# Patient Record
Sex: Female | Born: 1970 | Race: White | Hispanic: No | Marital: Married | Smoking: Never smoker
Health system: Southern US, Community
[De-identification: ages and names within clinical notes are randomized; demographics above are authoritative.]

## PROBLEM LIST (undated history)

## (undated) DIAGNOSIS — J385 Laryngeal spasm: Secondary | ICD-10-CM

## (undated) DIAGNOSIS — I639 Cerebral infarction, unspecified: Secondary | ICD-10-CM

## (undated) DIAGNOSIS — E042 Nontoxic multinodular goiter: Secondary | ICD-10-CM

## (undated) DIAGNOSIS — M3500 Sicca syndrome, unspecified: Secondary | ICD-10-CM

## (undated) DIAGNOSIS — R51 Headache: Secondary | ICD-10-CM

## (undated) DIAGNOSIS — K219 Gastro-esophageal reflux disease without esophagitis: Secondary | ICD-10-CM

## (undated) DIAGNOSIS — I776 Arteritis, unspecified: Secondary | ICD-10-CM

## (undated) DIAGNOSIS — Z9889 Other specified postprocedural states: Secondary | ICD-10-CM

## (undated) DIAGNOSIS — R519 Headache, unspecified: Secondary | ICD-10-CM

## (undated) DIAGNOSIS — E0590 Thyrotoxicosis, unspecified without thyrotoxic crisis or storm: Secondary | ICD-10-CM

## (undated) DIAGNOSIS — M359 Systemic involvement of connective tissue, unspecified: Secondary | ICD-10-CM

## (undated) DIAGNOSIS — G629 Polyneuropathy, unspecified: Secondary | ICD-10-CM

## (undated) HISTORY — DX: Arteritis, unspecified: I77.6

## (undated) HISTORY — DX: Headache: R51

## (undated) HISTORY — DX: Other specified postprocedural states: Z98.890

## (undated) HISTORY — DX: Cerebral infarction, unspecified: I63.9

## (undated) HISTORY — DX: Sjogren syndrome, unspecified: M35.00

## (undated) HISTORY — PX: CERVICAL CONE BIOPSY: SUR198

## (undated) HISTORY — PX: WISDOM TOOTH EXTRACTION: SHX21

## (undated) HISTORY — PX: FOOT SURGERY: SHX648

## (undated) HISTORY — DX: Headache, unspecified: R51.9

---

## 2005-03-09 HISTORY — PX: THYMECTOMY: SHX1063

## 2006-07-27 HISTORY — PX: OTHER SURGICAL HISTORY: SHX169

## 2009-01-01 ENCOUNTER — Emergency Department (HOSPITAL_COMMUNITY): Admission: EM | Admit: 2009-01-01 | Discharge: 2009-01-01 | Payer: Self-pay | Admitting: Emergency Medicine

## 2009-02-13 ENCOUNTER — Ambulatory Visit (HOSPITAL_COMMUNITY): Admission: RE | Admit: 2009-02-13 | Discharge: 2009-02-13 | Payer: Self-pay | Admitting: Obstetrics and Gynecology

## 2009-03-12 ENCOUNTER — Ambulatory Visit (HOSPITAL_COMMUNITY): Admission: RE | Admit: 2009-03-12 | Discharge: 2009-03-12 | Payer: Self-pay | Admitting: Obstetrics and Gynecology

## 2009-04-11 ENCOUNTER — Ambulatory Visit (HOSPITAL_COMMUNITY): Admission: RE | Admit: 2009-04-11 | Discharge: 2009-04-11 | Payer: Self-pay | Admitting: Obstetrics and Gynecology

## 2009-05-02 ENCOUNTER — Ambulatory Visit (HOSPITAL_COMMUNITY): Admission: RE | Admit: 2009-05-02 | Discharge: 2009-05-02 | Payer: Self-pay | Admitting: Obstetrics and Gynecology

## 2009-06-03 ENCOUNTER — Ambulatory Visit (HOSPITAL_COMMUNITY): Admission: RE | Admit: 2009-06-03 | Discharge: 2009-06-03 | Payer: Self-pay | Admitting: Obstetrics and Gynecology

## 2009-07-01 ENCOUNTER — Ambulatory Visit (HOSPITAL_COMMUNITY): Admission: RE | Admit: 2009-07-01 | Discharge: 2009-07-01 | Payer: Self-pay | Admitting: Obstetrics and Gynecology

## 2009-07-22 ENCOUNTER — Inpatient Hospital Stay (HOSPITAL_COMMUNITY): Admission: RE | Admit: 2009-07-22 | Discharge: 2009-07-24 | Payer: Self-pay | Admitting: Obstetrics and Gynecology

## 2009-07-27 ENCOUNTER — Encounter: Admission: RE | Admit: 2009-07-27 | Discharge: 2009-08-26 | Payer: Self-pay | Admitting: Obstetrics and Gynecology

## 2009-08-27 ENCOUNTER — Encounter: Admission: RE | Admit: 2009-08-27 | Discharge: 2009-08-29 | Payer: Self-pay | Admitting: Obstetrics and Gynecology

## 2010-03-29 IMAGING — US US OB FOLLOW-UP
1 series · 18 of 28 positions shown · non-contrast
Comparison: none

OBSTETRICAL ULTRASOUND:
 This ultrasound was performed in The [HOSPITAL], and the [facility] report will be stored to [REDACTED] PACS.  This report is also available in [HOSPITAL]?s accessANYware.

[Series 1: us ob follow-up · 40 acquisitions, 18 frames shown]
[im 1/40]
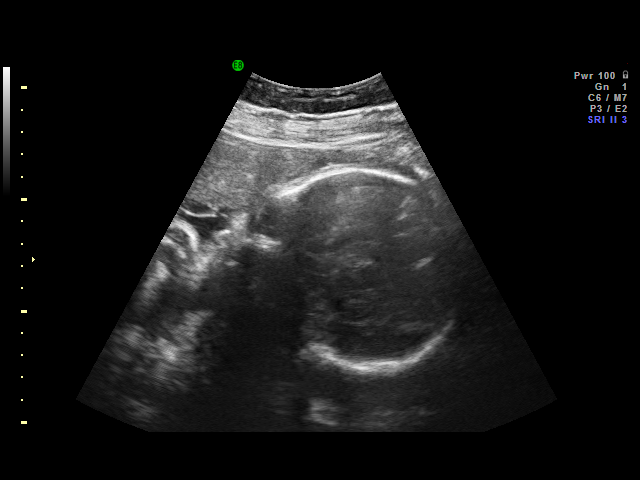
[im 3/40]
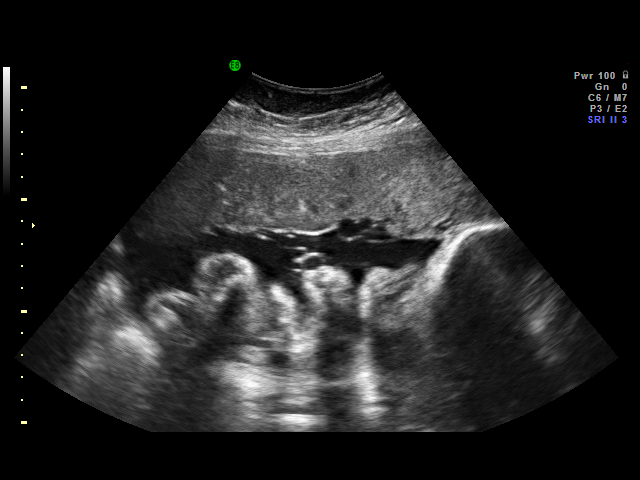
[im 5/40]
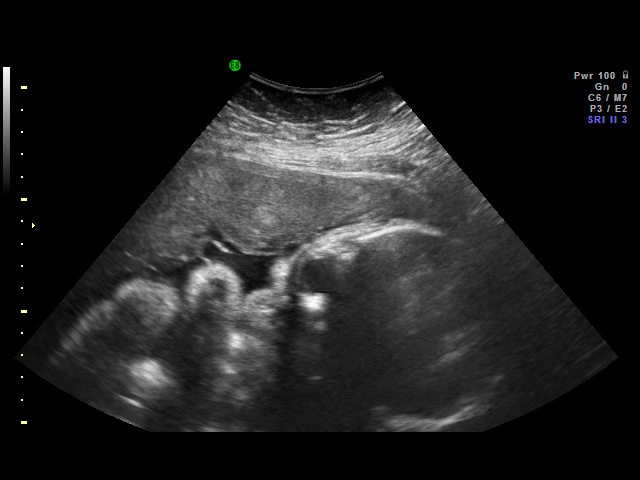
[im 8/40]
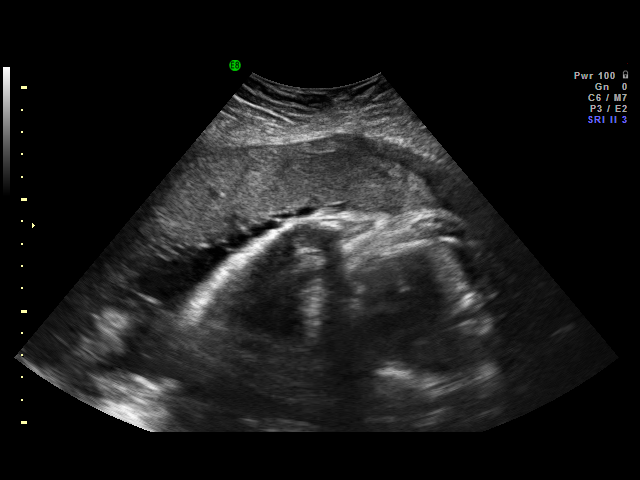
[im 11/40]
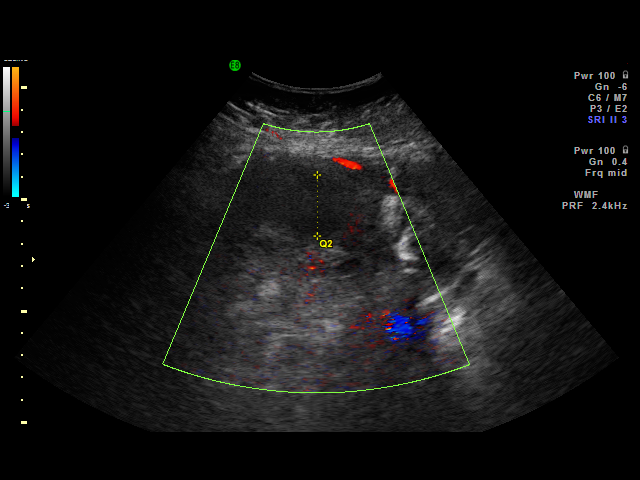
[im 12/40]
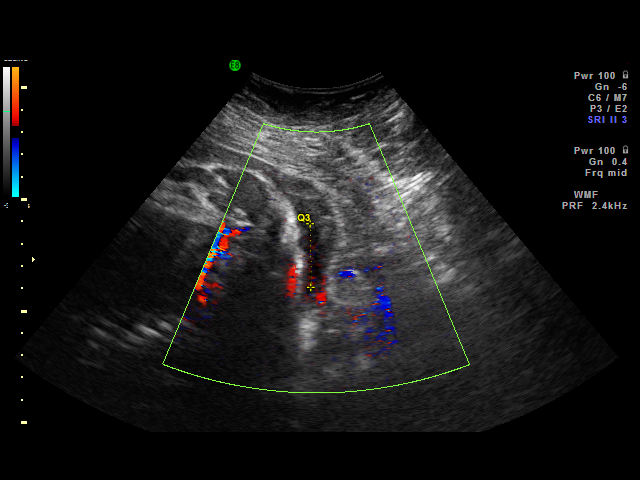
[im 15/40]
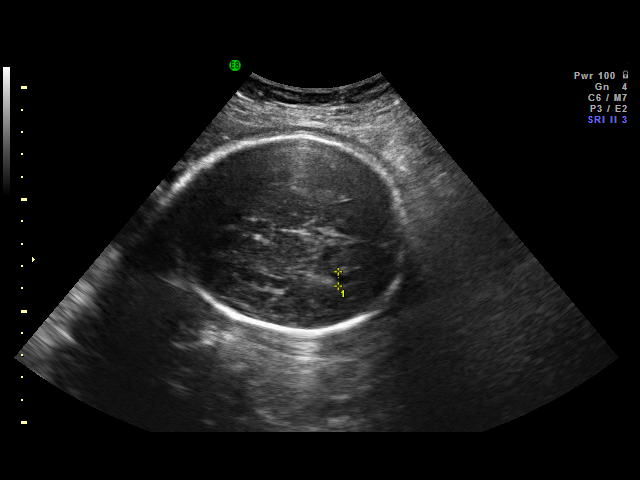
[im 16/40]
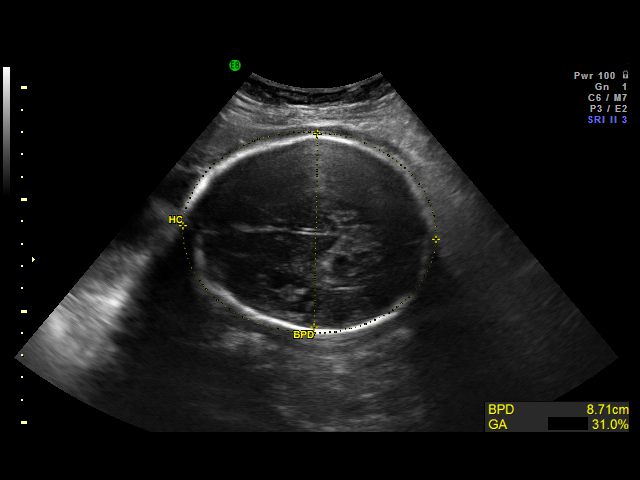
[im 19/40]
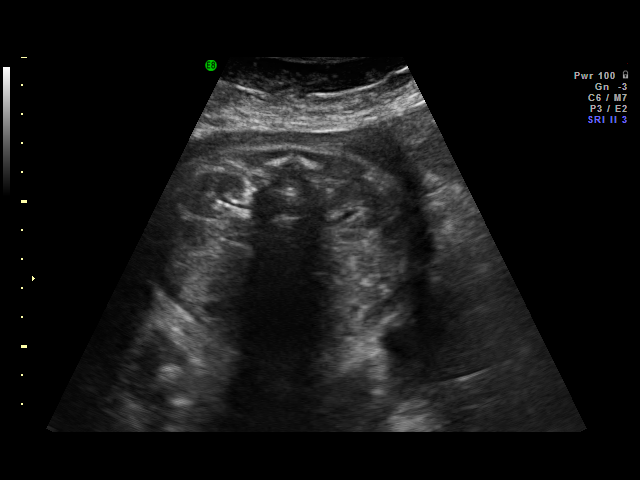
[im 21/40]
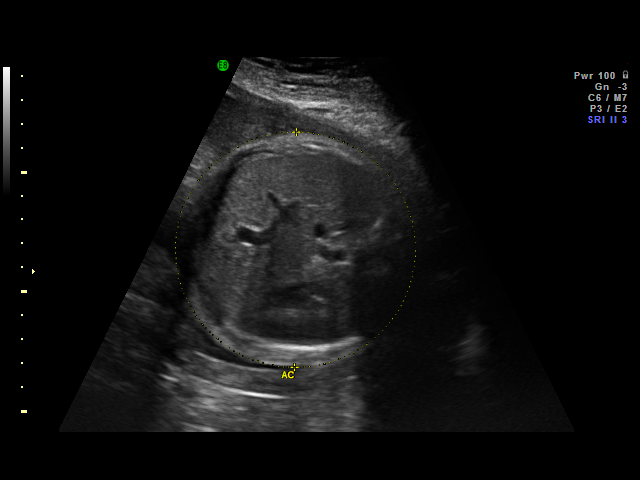
[im 24/40]
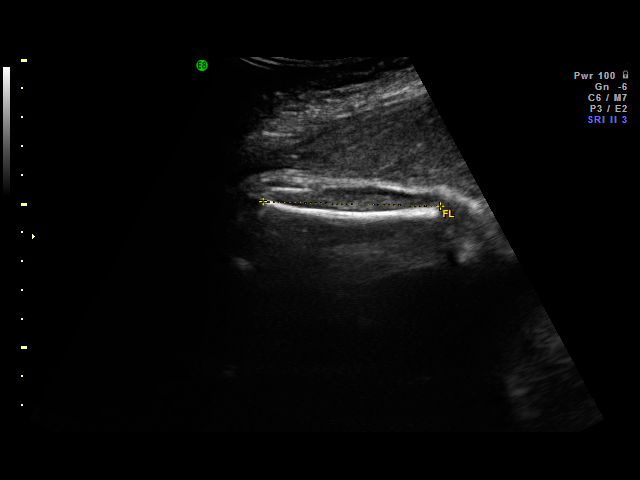
[im 25/40]
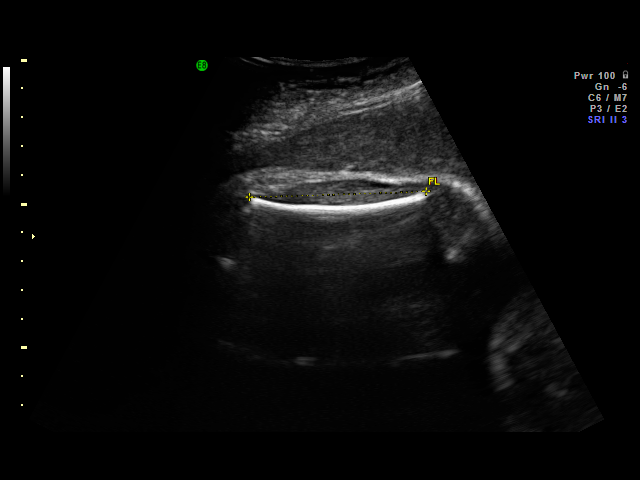
[im 28/40]
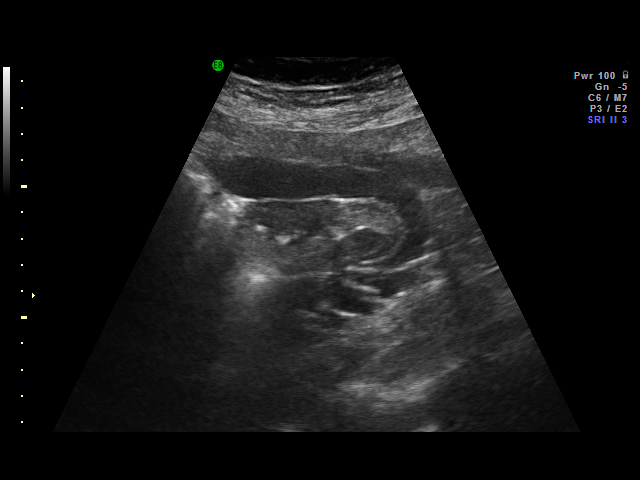
[im 31/40]
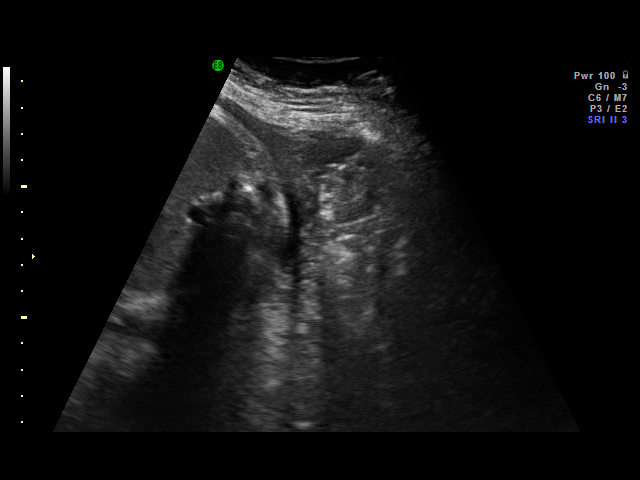
[im 32/40]
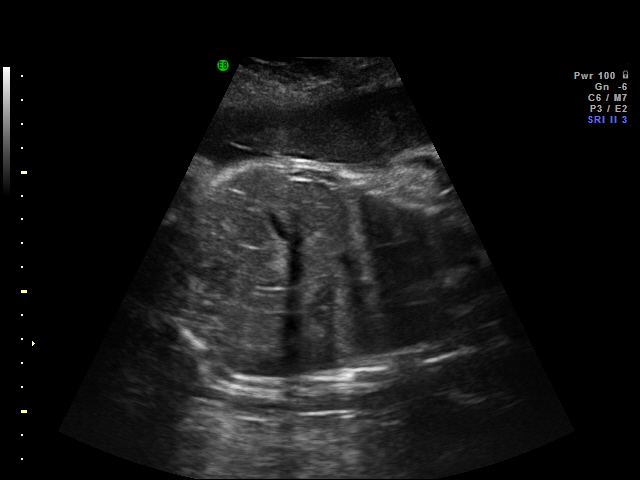
[im 35/40]
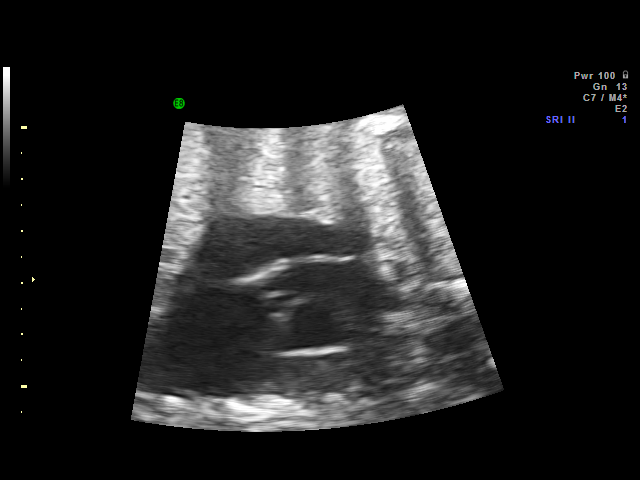
[im 37/40]
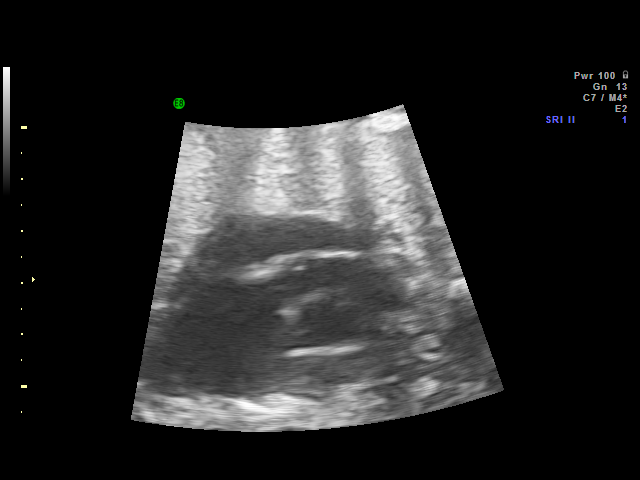
[im 40/40]
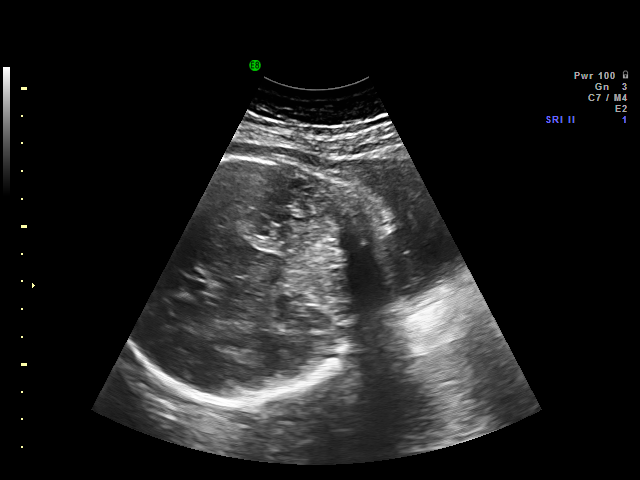

[18 of 28 positions shown; findings below may reference images not displayed]

IMPRESSION: [facility] has also been faxed to the ordering physician.

## 2010-10-27 LAB — COMPREHENSIVE METABOLIC PANEL
Albumin: 3.1 g/dL — ABNORMAL LOW (ref 3.5–5.2)
Calcium: 10 mg/dL (ref 8.4–10.5)
Chloride: 105 mEq/L (ref 96–112)
Creatinine, Ser: 0.6 mg/dL (ref 0.4–1.2)
Total Bilirubin: 0.3 mg/dL (ref 0.3–1.2)
Total Protein: 6 g/dL (ref 6.0–8.3)

## 2010-10-27 LAB — CBC
Hemoglobin: 10.7 g/dL — ABNORMAL LOW (ref 12.0–15.0)
Hemoglobin: 11.9 g/dL — ABNORMAL LOW (ref 12.0–15.0)
MCHC: 34 g/dL (ref 30.0–36.0)
MCHC: 34.6 g/dL (ref 30.0–36.0)
MCV: 88.3 fL (ref 78.0–100.0)
Platelets: 193 10*3/uL (ref 150–400)
RBC: 3.49 MIL/uL — ABNORMAL LOW (ref 3.87–5.11)
RBC: 3.98 MIL/uL (ref 3.87–5.11)
RDW: 13.1 % (ref 11.5–15.5)
WBC: 12.9 10*3/uL — ABNORMAL HIGH (ref 4.0–10.5)
WBC: 9.6 10*3/uL (ref 4.0–10.5)

## 2010-10-27 LAB — URIC ACID: Uric Acid, Serum: 7.7 mg/dL — ABNORMAL HIGH (ref 2.4–7.0)

## 2010-10-27 LAB — RPR: RPR Ser Ql: NONREACTIVE

## 2010-11-02 LAB — LUPUS ANTICOAGULANT PANEL
DRVVT: 38.3 secs (ref 36.1–47.0)
Lupus Anticoagulant: NOT DETECTED

## 2010-11-02 LAB — CARDIOLIPIN ANTIBODIES, IGM+IGG: Anticardiolipin IgG: <10 [GPL'U] — ABNORMAL LOW (ref <11)

## 2010-11-02 LAB — TSH: TSH: 0.204 u[IU]/mL — ABNORMAL LOW (ref 0.350–4.500)

## 2010-11-02 LAB — T4, FREE: Free T4: 0.98 ng/dL (ref 0.80–1.80)

## 2010-11-03 LAB — WET PREP, GENITAL
Clue Cells Wet Prep HPF POC: NONE SEEN
Yeast Wet Prep HPF POC: NONE SEEN

## 2010-11-03 LAB — GC/CHLAMYDIA PROBE AMP, GENITAL
Chlamydia, DNA Probe: NEGATIVE
GC Probe Amp, Genital: NEGATIVE

## 2010-11-03 LAB — CBC
Hemoglobin: 12.7 g/dL (ref 12.0–15.0)
RBC: 4.3 MIL/uL (ref 3.87–5.11)
RDW: 13.7 % (ref 11.5–15.5)
WBC: 8.5 10*3/uL (ref 4.0–10.5)

## 2010-11-03 LAB — URINALYSIS, ROUTINE W REFLEX MICROSCOPIC
Leukocytes, UA: NEGATIVE
Nitrite: NEGATIVE
Specific Gravity, Urine: 1.028 (ref 1.005–1.030)
Urobilinogen, UA: 1 mg/dL (ref 0.0–1.0)

## 2010-11-03 LAB — DIFFERENTIAL
Basophils Absolute: 0 10*3/uL (ref 0.0–0.1)
Lymphocytes Relative: 16 % (ref 12–46)
Lymphs Abs: 1.4 10*3/uL (ref 0.7–4.0)
Monocytes Absolute: 0.5 10*3/uL (ref 0.1–1.0)
Monocytes Relative: 6 % (ref 3–12)
Neutro Abs: 6.5 10*3/uL (ref 1.7–7.7)

## 2010-11-03 LAB — URINE MICROSCOPIC-ADD ON

## 2010-11-03 LAB — POCT PREGNANCY, URINE: Preg Test, Ur: POSITIVE

## 2010-11-03 LAB — URINE CULTURE

## 2012-07-27 HISTORY — PX: OTHER SURGICAL HISTORY: SHX169

## 2012-09-12 ENCOUNTER — Emergency Department (HOSPITAL_COMMUNITY)
Admission: EM | Admit: 2012-09-12 | Discharge: 2012-09-12 | Disposition: A | Discharge status: Home / Self Care | Payer: 59 | Attending: Emergency Medicine | Admitting: Emergency Medicine

## 2012-09-12 ENCOUNTER — Encounter (HOSPITAL_COMMUNITY): Payer: Self-pay | Admitting: Emergency Medicine

## 2012-09-12 DIAGNOSIS — Z862 Personal history of diseases of the blood and blood-forming organs and certain disorders involving the immune mechanism: Secondary | ICD-10-CM | POA: Insufficient documentation

## 2012-09-12 DIAGNOSIS — Z8639 Personal history of other endocrine, nutritional and metabolic disease: Secondary | ICD-10-CM | POA: Insufficient documentation

## 2012-09-12 DIAGNOSIS — R05 Cough: Secondary | ICD-10-CM | POA: Insufficient documentation

## 2012-09-12 DIAGNOSIS — R059 Cough, unspecified: Secondary | ICD-10-CM | POA: Insufficient documentation

## 2012-09-12 DIAGNOSIS — R509 Fever, unspecified: Secondary | ICD-10-CM | POA: Insufficient documentation

## 2012-09-12 DIAGNOSIS — J069 Acute upper respiratory infection, unspecified: Secondary | ICD-10-CM | POA: Insufficient documentation

## 2012-09-12 DIAGNOSIS — R52 Pain, unspecified: Secondary | ICD-10-CM | POA: Insufficient documentation

## 2012-09-12 HISTORY — DX: Thyrotoxicosis, unspecified without thyrotoxic crisis or storm: E05.90

## 2012-09-12 HISTORY — DX: Systemic involvement of connective tissue, unspecified: M35.9

## 2012-09-12 MED ORDER — BENZONATATE 200 MG PO CAPS: 200.0000 mg | ORAL_CAPSULE | Freq: Three times a day (TID) | ORAL | Status: DC | PRN

## 2012-09-12 MED ORDER — PSEUDOEPHEDRINE HCL ER 120 MG PO TB12
120.0000 mg | ORAL_TABLET | ORAL | Status: AC
  Administered 2012-09-12: 120 mg via ORAL
  Filled 2012-09-12: qty 1

## 2012-09-12 MED ORDER — BENZONATATE 100 MG PO CAPS
200.0000 mg | ORAL_CAPSULE | Freq: Three times a day (TID) | ORAL | Status: DC | PRN
  Administered 2012-09-12: 200 mg via ORAL
  Filled 2012-09-12: qty 2

## 2012-09-12 MED ORDER — PSEUDOEPHEDRINE HCL ER 120 MG PO TB12: 120.0000 mg | ORAL_TABLET | Freq: Two times a day (BID) | ORAL | Status: DC

## 2012-09-12 NOTE — ED Provider Notes (Signed)
History     CSN: [id number]  Arrival date & time 09/12/12  0145   First MD Initiated Contact with Patient 09/12/12 0209      Chief Complaint  Patient presents with  . Sore Throat    (Consider location/radiation/quality/duration/timing/severity/associated sxs/prior treatment) HPI 42 year old female presents to emergency department with complaint of cough, sore throat, generalized body aches and temp to 99. Patient is nonsmoker. She reports she is exposed to many things as she is a Engineer, civil (consulting), and her son has recently has had a viral illness. She has taken DayQuil without improvement in symptoms. No nausea no vomiting. She is eating and drinking well.   Past Medical History  Diagnosis Date  . Hyperthyroidism   . Autoimmune disease     Past Surgical History  Procedure Laterality Date  . Thymectomy      No family history on file.  History  Substance Use Topics  . Smoking status: Never Smoker   . Smokeless tobacco: Not on file  . Alcohol Use: No    OB History   Grav Para Term Preterm Abortions TAB SAB Ect Mult Living                  Review of Systems  All other systems reviewed and are negative.    Allergies  Review of patient's allergies indicates not on file.  Home Medications   Current Outpatient Rx  Name  Route  Sig  Dispense  Refill  . benzonatate (TESSALON) 200 MG capsule   Oral   Take 1 capsule (200 mg total) by mouth 3 (three) times daily as needed for cough.   20 capsule   0   . pseudoephedrine (SUDAFED) 120 MG 12 hr tablet   Oral   Take 1 tablet (120 mg total) by mouth every 12 (twelve) hours.   30 tablet   0     BP 133/86  Pulse 116  Temp(Src) 98.9 F (37.2 C) (Oral)  Resp 20  SpO2 99%  LMP 08/31/2012  Physical Exam  Nursing note and vitals reviewed. Constitutional: She is oriented to person, place, and time. She appears well-developed and well-nourished.  HENT:  Head: Normocephalic and atraumatic.  Right Ear: External ear normal.   Left Ear: External ear normal.  Nose: Nose normal.  Mouth/Throat: Oropharynx is clear and moist.  Rhinorrhea and nasal congestion  Eyes: Conjunctivae and EOM are normal. Pupils are equal, round, and reactive to light.  Neck: Normal range of motion. Neck supple. No JVD present. No tracheal deviation present. No thyromegaly present.  Cardiovascular: Normal rate, regular rhythm, normal heart sounds and intact distal pulses.  Exam reveals no gallop and no friction rub.   No murmur heard. Pulmonary/Chest: Effort normal and breath sounds normal. No stridor. No respiratory distress. She has no wheezes. She has no rales. She exhibits no tenderness.  Abdominal: Soft. Bowel sounds are normal. She exhibits no distension and no mass. There is no tenderness. There is no rebound and no guarding.  Musculoskeletal: Normal range of motion. She exhibits no edema and no tenderness.  Lymphadenopathy:    She has no cervical adenopathy.  Neurological: She is alert and oriented to person, place, and time. She exhibits normal muscle tone. Coordination normal.  Skin: Skin is warm and dry. No rash noted. No erythema. No pallor.  Psychiatric: She has a normal mood and affect. Her behavior is normal. Judgment and thought content normal.    ED Course  Procedures (including critical care time)  Labs Reviewed - No data to display No results found.   1. Viral upper respiratory illness       MDM  42 year old female with upper respiratory illness. Will treat symptoms. Patient advised to drink plenty of water, follow up with her primary care Dr.        Olivia Mackie, MD 09/12/12 [phone]

## 2012-09-12 NOTE — ED Notes (Signed)
C/o sore throat, non-productive cough, generalized body aches, and low grade fever since Friday.

## 2012-09-12 NOTE — ED Notes (Signed)
Pt states low grade fever of 99.4, sore throat, non productive cough and chills

## 2012-09-12 NOTE — ED Notes (Signed)
Pt states understanding of discharge instructions 

## 2012-12-27 ENCOUNTER — Other Ambulatory Visit (HOSPITAL_COMMUNITY): Payer: Self-pay | Admitting: Internal Medicine

## 2012-12-27 DIAGNOSIS — Z139 Encounter for screening, unspecified: Secondary | ICD-10-CM

## 2013-01-02 ENCOUNTER — Ambulatory Visit (HOSPITAL_COMMUNITY)
Admission: RE | Admit: 2013-01-02 | Discharge: 2013-01-02 | Disposition: A | Discharge status: Home / Self Care | Payer: 59 | Source: Ambulatory Visit | Attending: Internal Medicine | Admitting: Internal Medicine

## 2013-01-02 DIAGNOSIS — Z1231 Encounter for screening mammogram for malignant neoplasm of breast: Secondary | ICD-10-CM | POA: Insufficient documentation

## 2013-01-02 DIAGNOSIS — Z139 Encounter for screening, unspecified: Secondary | ICD-10-CM

## 2013-01-03 ENCOUNTER — Other Ambulatory Visit: Payer: Self-pay | Admitting: Internal Medicine

## 2013-01-03 DIAGNOSIS — R928 Other abnormal and inconclusive findings on diagnostic imaging of breast: Secondary | ICD-10-CM

## 2013-01-04 ENCOUNTER — Other Ambulatory Visit: Payer: Self-pay | Admitting: Internal Medicine

## 2013-01-04 ENCOUNTER — Ambulatory Visit (HOSPITAL_COMMUNITY)
Admission: RE | Admit: 2013-01-04 | Discharge: 2013-01-04 | Disposition: A | Discharge status: Home / Self Care | Payer: 59 | Source: Ambulatory Visit | Attending: Internal Medicine | Admitting: Internal Medicine

## 2013-01-04 DIAGNOSIS — R928 Other abnormal and inconclusive findings on diagnostic imaging of breast: Secondary | ICD-10-CM

## 2013-01-11 ENCOUNTER — Encounter (HOSPITAL_COMMUNITY): Payer: 59

## 2013-04-19 ENCOUNTER — Other Ambulatory Visit: Payer: Self-pay | Admitting: Internal Medicine

## 2013-04-19 DIAGNOSIS — Z803 Family history of malignant neoplasm of breast: Secondary | ICD-10-CM

## 2013-04-19 DIAGNOSIS — N63 Unspecified lump in unspecified breast: Secondary | ICD-10-CM

## 2013-05-11 ENCOUNTER — Ambulatory Visit
Admission: RE | Admit: 2013-05-11 | Discharge: 2013-05-11 | Disposition: A | Discharge status: Home / Self Care | Payer: BC Managed Care – PPO | Source: Ambulatory Visit | Attending: Internal Medicine | Admitting: Internal Medicine

## 2013-05-11 DIAGNOSIS — Z803 Family history of malignant neoplasm of breast: Secondary | ICD-10-CM

## 2013-05-11 DIAGNOSIS — N63 Unspecified lump in unspecified breast: Secondary | ICD-10-CM

## 2013-05-11 MED ORDER — GADOBENATE DIMEGLUMINE 529 MG/ML IV SOLN
20.0000 mL | Freq: Once | INTRAVENOUS | Status: AC | PRN
  Administered 2013-05-11: 20 mL via INTRAVENOUS

## 2013-07-17 ENCOUNTER — Encounter: Payer: Self-pay | Admitting: Gastroenterology

## 2013-08-16 ENCOUNTER — Ambulatory Visit (INDEPENDENT_AMBULATORY_CARE_PROVIDER_SITE_OTHER): Payer: BC Managed Care – PPO | Admitting: Gastroenterology

## 2013-08-16 ENCOUNTER — Encounter: Payer: Self-pay | Admitting: Gastroenterology

## 2013-08-16 VITALS — BP 117/70 | HR 71 | Temp 98.4°F | Ht 65.0 in | Wt 240.0 lb

## 2013-08-16 DIAGNOSIS — K219 Gastro-esophageal reflux disease without esophagitis: Secondary | ICD-10-CM

## 2013-08-16 DIAGNOSIS — R195 Other fecal abnormalities: Secondary | ICD-10-CM | POA: Insufficient documentation

## 2013-08-16 DIAGNOSIS — Z800 Family history of malignant neoplasm of digestive organs: Secondary | ICD-10-CM

## 2013-08-16 DIAGNOSIS — K625 Hemorrhage of anus and rectum: Secondary | ICD-10-CM

## 2013-08-16 DIAGNOSIS — R110 Nausea: Secondary | ICD-10-CM

## 2013-08-16 MED ORDER — PEG 3350-KCL-NA BICARB-NACL 420 G PO SOLR: 4000.0000 mL | ORAL | Status: DC

## 2013-08-16 NOTE — Patient Instructions (Signed)
1. Colonoscopy with possible upper endoscopy as discussed. Please see separate instructions. 2. I will request a copy of your labs from Dr. Scharlene GlossHall's office for further review.

## 2013-08-16 NOTE — Assessment & Plan Note (Addendum)
43 year old lady with extensive family history of multiple malignancies including numerous family members with colon cancer at young age who presents for further evaluation of intermittent rectal bleeding, bowel change, Hemoccult-positive stool. She's never had a colonoscopy. Recommend colonoscopy at this time given the extensive family history, abnormal genetic profile as outlined above.  I have discussed the risks, alternatives, benefits with regards to but not limited to the risk of reaction to medication, bleeding, infection, perforation and the patient is agreeable to proceed. Written consent to be obtained.  She also has 10 month history of nausea (with no improvement status post parathyroidectomy and decreasing calcium level), intermittent heartburn, nonspecific dysphagia possibly related to parathyroidectomy last fall, Hemoccult-positive stool in the setting of NSAIDs. Consider upper endoscopy especially if colonoscopy is unremarkable.  Obtain copy of most recent labs from Dr. Margo AyeHall. She will continue Prevacid 15 mg daily for now.

## 2013-08-16 NOTE — Progress Notes (Signed)
Primary Care Physician:  Catalina Pizza, MD  Primary Gastroenterologist:  Jonette Eva, MD   Chief Complaint  Patient presents with  . Rectal Bleeding  . Nausea    HPI:  Jeanette Cummings is a 43 y.o. female here for further evaluation of heme positive stool X 3. Recent labs have been requested. LFTs, CBC normal 10/2012.   She has extensive FH of numerous cancers, including multiple relatives with colon cancer. States she has had genetic testing at [facility]. 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  C/O rectal bleeding for one year.  Symptoms intermittent. Sometimes bleeds heavily. Parathyroidectomy in 02/2013 for nodules. Calcium levels have been high, improving but not normal. Bowels have been very irregular over the past several months. Has 15 small stools daily ranging from balls to loose. No melena. Denies chronic "stomach" issues up until past year. Has had nausea for 10 months. Placed on Prevacid for possible GERD but no great improvement. Symptoms have not improved with drop in calcium level. Back around age 10 she had back GERD and was told she had hiatal hernia. No prior EGD. She had some GERD with last pregnancy four years ago.  No abdominal pain. Some feeling of dysphagia after her parathyroidectomy in 02/2013.   Started on Topamax recently and has had heartburn every day for past couple of weeks. Had been on naproxen and advil regulary until rectal bleeding. Try to alternate and not take every day. Takes for bone pain.   Current Outpatient Prescriptions  Medication Sig Dispense Refill  . bisacodyl (DULCOLAX) 5 MG EC tablet Take 5 mg by mouth daily as needed for moderate constipation.      . calcium-vitamin D (OSCAL WITH D) 250-125 MG-UNIT per tablet Take 1 tablet by mouth 2 (two) times daily.      Marland Kitchen ibuprofen (ADVIL,MOTRIN) 600 MG tablet Take 600 mg by mouth 2 (two) times daily.      . lansoprazole (PREVACID) 15 MG capsule Take 15 mg by mouth daily at 12 noon.       . loratadine (CLARITIN) 10 MG tablet Take 10 mg by mouth daily.      . meclizine (ANTIVERT) 25 MG tablet Take 25 mg by mouth 4 (four) times daily as needed for dizziness.      . Multiple Vitamin (MULTIVITAMIN) capsule Take 1 capsule by mouth daily.      . naproxen (NAPROSYN) 500 MG tablet Take 500 mg by mouth 2 (two) times daily with a meal.      . ondansetron (ZOFRAN) 8 MG tablet Take 8 mg by mouth every 6 (six) hours as needed for nausea or vomiting.      . topiramate (TOPAMAX) 25 MG tablet Take 25 mg by mouth daily.      . pseudoephedrine (SUDAFED) 120 MG 12 hr tablet Take 1 tablet (120 mg total) by mouth every 12 (twelve) hours.  30 tablet  0   No current facility-administered medications for this visit.    Allergies as of 08/16/2013  . (No Known Allergies)    Past Medical History  Diagnosis Date  . Hyperthyroidism   . Autoimmune disease     large family history  . Frequent headaches   . History of cervical biopsy     multiple    Past Surgical History  Procedure Laterality Date  . Thymectomy  03/09/05  . Foot surgery      both  . Wisdom tooth extraction    . Parathyroidectomy  2014  Family History  Problem Relation Age of Onset  . Colon polyps Mother     numerous  . Colon cancer Maternal Aunt     diagnosed at age 43, deceased  . Colon cancer Cousin     diagnosed at age 43, deceased age 43  . Colon polyps Cousin     precancerous, age 7830s  . Breast cancer Mother     age 6130s  . Breast cancer Maternal Aunt     age 43, at age 43 new primry lung cancer deceased. Sjogrens', polymyositis, hyperthyroid, scleroderma  . Breast cancer Maternal Aunt     late 3860s  . Ovarian cancer Paternal Grandmother   . Ovarian cancer Cousin     age 43, paternal  . Cancer Mother     died from metastatic brain cancer, not from breast cancer, unknown primary  . Lung cancer Maternal Grandmother     died age 43  . Other Son     IgA/IgE deficient    History   Social History  .  Marital Status: Single    Spouse Name: N/A    Number of Children: 2  . Years of Education: N/A   Occupational History  .      Social History Main Topics  . Smoking status: Never Smoker   . Smokeless tobacco: Not on file  . Alcohol Use: No  . Drug Use: No  . Sexual Activity: Not on file   Other Topics Concern  . Not on file   Social History Narrative  . No narrative on file      ROS:  General: Negative for anorexia, weight loss, fever, chills, fatigue, weakness. Eyes: Negative for vision changes.  ENT: Negative for hoarseness, difficulty swallowing , nasal congestion. CV: Negative for chest pain, angina, palpitations, dyspnea on exertion, peripheral edema.  Respiratory: Negative for dyspnea at rest, dyspnea on exertion, cough, sputum, wheezing.  GI: See history of present illness. GU:  Negative for dysuria, hematuria, urinary incontinence, urinary frequency, nocturnal urination.  MS: Negative for joint pain, low back pain.  Derm: Negative for rash or itching.  Neuro: Negative for weakness, abnormal sensation, seizure. + frequent headaches, +tremors.  Psych: Negative for anxiety, depression, suicidal ideation, hallucinations.  Endo: Negative for unusual weight change.  Heme: Negative for bruising or bleeding. Allergy: Negative for rash or hives.    Physical Examination:  BP 117/70  Pulse 71  Temp(Src) 98.4 F (36.9 C) (Oral)  Ht 5\' 5"  (1.651 m)  Wt 240 lb (108.863 kg)  BMI 39.94 kg/m2  LMP 07/24/2013   General: Well-nourished, well-developed in no acute distress.  Head: Normocephalic, atraumatic.   Eyes: Conjunctiva pink, no icterus. Mouth: Oropharyngeal mucosa moist and pink , no lesions erythema or exudate. Neck: Supple without thyromegaly, masses, or lymphadenopathy.  Lungs: Clear to auscultation bilaterally.  Heart: Regular rate and rhythm, no murmurs rubs or gallops.  Abdomen: Bowel sounds are normal, nontender, nondistended, no hepatosplenomegaly or  masses, no abdominal bruits or    hernia , no rebound or guarding.   Rectal: not performed Extremities: No lower extremity edema. No clubbing or deformities.  Neuro: Alert and oriented x 4 , grossly normal neurologically.  Skin: Warm and dry, no rash or jaundice.   Psych: Alert and cooperative, normal mood and affect.  Labs: Labs from April 2014 total bilirubin 0.3, alkaline phosphatase 84, AST 18, ALT 24, albumin 4.8, hemoglobin 13.5, MCV 81.1, platelets 290,000, white blood cell count 6300.  Imaging Studies: No results found.

## 2013-08-17 NOTE — Progress Notes (Signed)
cc'd to pcp 

## 2013-08-18 ENCOUNTER — Encounter (HOSPITAL_COMMUNITY): Payer: Self-pay | Admitting: Pharmacy Technician

## 2013-09-01 ENCOUNTER — Encounter (HOSPITAL_COMMUNITY): Payer: Self-pay | Admitting: *Deleted

## 2013-09-01 ENCOUNTER — Ambulatory Visit (HOSPITAL_COMMUNITY)
Admission: RE | Admit: 2013-09-01 | Discharge: 2013-09-01 | Disposition: A | Discharge status: Home / Self Care | Payer: BC Managed Care – PPO | Source: Ambulatory Visit | Attending: Gastroenterology | Admitting: Gastroenterology

## 2013-09-01 ENCOUNTER — Encounter (HOSPITAL_COMMUNITY)
Admission: RE | Disposition: A | Discharge status: Home / Self Care | Payer: Self-pay | Source: Ambulatory Visit | Attending: Gastroenterology

## 2013-09-01 DIAGNOSIS — Z800 Family history of malignant neoplasm of digestive organs: Secondary | ICD-10-CM

## 2013-09-01 DIAGNOSIS — K2940 Chronic atrophic gastritis without bleeding: Secondary | ICD-10-CM | POA: Insufficient documentation

## 2013-09-01 DIAGNOSIS — K219 Gastro-esophageal reflux disease without esophagitis: Secondary | ICD-10-CM

## 2013-09-01 DIAGNOSIS — K648 Other hemorrhoids: Secondary | ICD-10-CM | POA: Insufficient documentation

## 2013-09-01 DIAGNOSIS — K625 Hemorrhage of anus and rectum: Secondary | ICD-10-CM

## 2013-09-01 DIAGNOSIS — K449 Diaphragmatic hernia without obstruction or gangrene: Secondary | ICD-10-CM | POA: Insufficient documentation

## 2013-09-01 DIAGNOSIS — R110 Nausea: Secondary | ICD-10-CM | POA: Insufficient documentation

## 2013-09-01 DIAGNOSIS — R195 Other fecal abnormalities: Secondary | ICD-10-CM

## 2013-09-01 HISTORY — PX: ESOPHAGOGASTRODUODENOSCOPY: SHX5428

## 2013-09-01 HISTORY — DX: Nontoxic multinodular goiter: E04.2

## 2013-09-01 HISTORY — PX: COLONOSCOPY: SHX5424

## 2013-09-01 SURGERY — COLONOSCOPY
Anesthesia: Moderate Sedation

## 2013-09-01 MED ORDER — SODIUM CHLORIDE 0.9 % IV SOLN
INTRAVENOUS | Status: DC
  Administered 2013-09-01: 10:00:00 via INTRAVENOUS

## 2013-09-01 MED ORDER — MEPERIDINE HCL 100 MG/ML IJ SOLN
INTRAMUSCULAR | Status: AC
  Filled 2013-09-01: qty 2

## 2013-09-01 MED ORDER — MEPERIDINE HCL 100 MG/ML IJ SOLN
INTRAMUSCULAR | Status: DC | PRN
  Administered 2013-09-01 (×3): 25 mg via INTRAVENOUS
  Administered 2013-09-01: 50 mg via INTRAVENOUS
  Administered 2013-09-01 (×2): 25 mg via INTRAVENOUS

## 2013-09-01 MED ORDER — LIDOCAINE VISCOUS 2 % MT SOLN
OROMUCOSAL | Status: DC | PRN
  Administered 2013-09-01: 2 mL via OROMUCOSAL

## 2013-09-01 MED ORDER — LANSOPRAZOLE 30 MG PO CPDR: DELAYED_RELEASE_CAPSULE | ORAL | Status: DC

## 2013-09-01 MED ORDER — MIDAZOLAM HCL 5 MG/5ML IJ SOLN
INTRAMUSCULAR | Status: DC | PRN
  Administered 2013-09-01: 2 mg via INTRAVENOUS
  Administered 2013-09-01: 1 mg via INTRAVENOUS
  Administered 2013-09-01 (×3): 2 mg via INTRAVENOUS
  Administered 2013-09-01: 1 mg via INTRAVENOUS

## 2013-09-01 MED ORDER — SIMETHICONE 40 MG/0.6ML PO SUSP
ORAL | Status: DC | PRN
  Administered 2013-09-01: 10:00:00

## 2013-09-01 MED ORDER — LIDOCAINE VISCOUS 2 % MT SOLN
OROMUCOSAL | Status: AC
  Filled 2013-09-01: qty 15

## 2013-09-01 MED ORDER — MIDAZOLAM HCL 5 MG/5ML IJ SOLN
INTRAMUSCULAR | Status: AC
  Filled 2013-09-01: qty 10

## 2013-09-01 NOTE — H&P (Signed)
Primary Care Physician:  Catalina PizzaHALL, ZACH, MD Primary Gastroenterologist:  Dr. Darrick PennaFields  Pre-Procedure History & Physical: HPI:  Jeanette Cummings is a 43 y.o. female here for BRBPR/NAUSEA.  Past Medical History  Diagnosis Date  . Hyperthyroidism   . Autoimmune disease     large family history  . Frequent headaches   . History of cervical biopsy     multiple  . Multiple thyroid nodules    Past Surgical History  Procedure Laterality Date  . Thymectomy  03/09/05  . Foot surgery      both  . Wisdom tooth extraction    . Parathyroidectomy  2014  . History of thyroid radiation  2008    Prior to Admission medications   Medication Sig Start Date End Date Taking? Authorizing Provider  acetaminophen (TYLENOL) 500 MG tablet Take 1,000 mg by mouth every 6 (six) hours as needed.   Yes Historical Provider, MD  calcium carbonate 1250 MG capsule Take 1,250 mg by mouth 2 (two) times daily with a meal.   Yes Historical Provider, MD  CALCIUM MAGNESIUM 750 PO Take 1 tablet by mouth daily.   Yes Historical Provider, MD  calcium-vitamin D (OSCAL WITH D) 250-125 MG-UNIT per tablet Take 1 tablet by mouth 2 (two) times daily.   Yes Historical Provider, MD  cholecalciferol (VITAMIN D) 1000 UNITS tablet Take 1,000 Units by mouth 2 (two) times daily.   Yes Historical Provider, MD  ibuprofen (ADVIL,MOTRIN) 600 MG tablet Take 600 mg by mouth 2 (two) times daily as needed for moderate pain.    Yes Historical Provider, MD  lansoprazole (PREVACID) 15 MG capsule Take 15 mg by mouth daily at 12 noon.   Yes Historical Provider, MD  loratadine (CLARITIN) 10 MG tablet Take 10 mg by mouth daily.   Yes Historical Provider, MD  meclizine (ANTIVERT) 25 MG tablet Take 25 mg by mouth 4 (four) times daily as needed for dizziness.   Yes Historical Provider, MD  Multiple Vitamin (MULTIVITAMIN) capsule Take 1 capsule by mouth daily.   Yes Historical Provider, MD  naproxen (NAPROSYN) 500 MG tablet Take 500 mg by mouth 2 (two) times  daily with a meal.   Yes Historical Provider, MD  ondansetron (ZOFRAN) 8 MG tablet Take 8 mg by mouth every 6 (six) hours as needed for nausea or vomiting.   Yes Historical Provider, MD  Polyethyl Glycol-Propyl Glycol (SYSTANE OP) Place 1-2 drops into both eyes every 4 (four) hours as needed (dry eyes).   Yes Historical Provider, MD  polyethylene glycol-electrolytes (TRILYTE) 420 G solution Take 4,000 mLs by mouth as directed. 08/16/13  Yes West BaliSandi L Kohle Winner, MD  topiramate (TOPAMAX) 25 MG tablet Take 25 mg by mouth daily.   Yes Historical Provider, MD   Allergies as of 08/16/2013  . (No Known Allergies)   Family History  Problem Relation Age of Onset  . Colon polyps Mother     numerous  . Colon cancer Maternal Aunt     diagnosed at age 43, deceased  . Colon cancer Cousin     diagnosed at age 43, deceased age 43  . Colon polyps Cousin     precancerous, age 7830s  . Breast cancer Mother     age 4830s  . Breast cancer Maternal Aunt     age 43, at age 43 new primry lung cancer deceased. Sjogrens', polymyositis, hyperthyroid, scleroderma  . Breast cancer Maternal Aunt     late 7560s  . Ovarian cancer Paternal Grandmother   .  Ovarian cancer Cousin     age 72, paternal  . Cancer Mother     died from metastatic brain cancer, not from breast cancer, unknown primary  . Lung cancer Maternal Grandmother     died age 8  . Other Son     IgA/IgE deficient   History   Social History  . Marital Status: Married    Spouse Name: N/A    Number of Children: 2  . Years of Education: N/A   Occupational History  .      Social History Main Topics  . Smoking status: Never Smoker   . Smokeless tobacco: Not on file  . Alcohol Use: No  . Drug Use: No  . Sexual Activity: Not on file   Other Topics Concern  . Not on file   Social History Narrative  . No narrative on file   Review of Systems: See HPI, otherwise negative ROS  Physical Exam: BP 125/85  Pulse 90  Temp(Src) 98.5 F (36.9 C)  (Oral)  Resp 24  Ht 5\' 5"  (1.651 m)  Wt 240 lb (108.863 kg)  BMI 39.94 kg/m2  SpO2 100%  LMP 08/27/2013 General:   Alert,  pleasant and cooperative in NAD Head:  Normocephalic and atraumatic. Neck:  Supple; Lungs:  Clear throughout to auscultation.    Heart:  Regular rate and rhythm. Abdomen:  Soft, nontender and nondistended. Normal bowel sounds, without guarding, and without rebound.   Neurologic:  Alert and  oriented x4;  grossly normal neurologically.  Impression/Plan:     BRBPR/NAUSEA  PLAN: TCS/EGD TODAY

## 2013-09-01 NOTE — Discharge Instructions (Signed)
Your NAUSEA MAY BE DUE TO GASTRITIS FROM IBUPROFEN OR NAPROXEN. YOUR UPPER ENDOSCOPY SHOWED A HIATAL HERNIA. You have internal hemorrhoids, WHICH CAN CAUSE RECTAL BLEEDING. I biopsied your stomach. YOUR COLON AND SMALL BOWEL ARE NORMAL.   INCREASE PREVACID. TAKE 30 MINUTES PRIOR TO MEALS TWICE DAILY.  DO NOT TAKE NAPROXEN AND IBUPROFEN AT THE SAME TIME. IT WILL CAUSE KIDNEY FAILURE.  AVOID ITEMS THAT TRIGGER GASTRITIS. SEE INFO BELOW.  FOLLOW A HIGH FIBER/LOW FAT DIET. AVOID ITEMS THAT CAUSE BLOATING. SEE INFO BELOW.  CONTINUE YOUR WEIGHT LOSS EFFORTS. IT WILL  DECREASE YOUR RISK FOR COLON CANCER.  YOUR BIOPSY RESULTS WILL BE BACK IN 7 DAYS.  FOLLOW UP IN 3 MOS.  NEXT COLONOSCOPY IN 5-10 YEARS.    ENDOSCOPY Care After Read the instructions outlined below and refer to this sheet in the next week. These discharge instructions provide you with general information on caring for yourself after you leave the hospital. While your treatment has been planned according to the most current medical practices available, unavoidable complications occasionally occur. If you have any problems or questions after discharge, call DR. Layla Gramm, [phone].  ACTIVITY  You may resume your regular activity, but move at a slower pace for the next 24 hours.   Take frequent rest periods for the next 24 hours.   Walking will help get rid of the air and reduce the bloated feeling in your belly (abdomen).   No driving for 24 hours (because of the medicine (anesthesia) used during the test).   You may shower.   Do not sign any important legal documents or operate any machinery for 24 hours (because of the anesthesia used during the test).    NUTRITION  Drink plenty of fluids.   You may resume your normal diet as instructed by your doctor.   Begin with a light meal and progress to your normal diet. Heavy or fried foods are harder to digest and may make you feel sick to your stomach (nauseated).    Avoid alcoholic beverages for 24 hours or as instructed.    MEDICATIONS  You may resume your normal medications.   WHAT YOU CAN EXPECT TODAY  Some feelings of bloating in the abdomen.   Passage of more gas than usual.   Spotting of blood in your stool or on the toilet paper  .  IF YOU HAD POLYPS REMOVED DURING THE ENDOSCOPY:  Eat a soft diet IF YOU HAVE NAUSEA, BLOATING, ABDOMINAL PAIN, OR VOMITING.    FINDING OUT THE RESULTS OF YOUR TEST Not all test results are available during your visit. DR. Darrick Penna WILL CALL YOU WITHIN 7 DAYS OF YOUR PROCEDUE WITH YOUR RESULTS. Do not assume everything is normal if you have not heard from DR. Ogle Hoeffner IN ONE WEEK, CALL HER OFFICE AT [phone].  SEEK IMMEDIATE MEDICAL ATTENTION AND CALL THE OFFICE: [phone] IF:  You have more than a spotting of blood in your stool.   Your belly is swollen (abdominal distention).   You are nauseated or vomiting.   You have a temperature over 101F.   You have abdominal pain or discomfort that is severe or gets worse throughout the day.  Gastritis  Gastritis is an inflammation (the body's way of reacting to injury and/or infection) of the stomach.It is often caused by bacterial (germ) infections. It can also be caused BY ASPIRIN, BC/GOODY POWDER'S, (IBUPROFEN) MOTRIN, OR ALEVE (NAPROXEN), chemicals (including alcohol), SPICY FOODS, and medications. This illness may be associated with generalized malaise (  feeling tired, not well), UPPER ABDOMINAL STOMACH cramps, and fever. One common bacterial cause of gastritis is an organism known as H. Pylori. This can be treated with antibiotics.     Hiatal Hernia A hiatal hernia occurs when a part of the stomach slides above the diaphragm. The diaphragm is the thin muscle separating the belly (abdomen) from the chest. A hiatal hernia can be something you are born with or develop over time. Hiatal hernias may allow stomach acid to flow back into your esophagus,  the tube which carries food from your mouth to your stomach. If this acid causes problems it is called GERD (gastro-esophageal reflux disease).   SYMPTOMS Common symptoms of GERD are heartburn (burning in your chest). This is worse when lying down or bending over. It may also cause belching and indigestion. Some of the things which make GERD worse are:  Increased weight pushes on stomach making acid rise more easily.   Smoking markedly increases acid production.   Alcohol decreases lower esophageal sphincter pressure (valve between stomach and esophagus), allowing acid from stomach into esophagus.   Late evening meals and going to bed with a full stomach increases pressure.   Anything that causes an increase in acid production.    HOME CARE INSTRUCTIONS  Try to achieve and maintain an ideal body weight.   Avoid drinking alcoholic beverages.   DO NOT smokE.   Do not wear tight clothing around your chest or stomach.   Eat smaller meals and eat more frequently. This keeps your stomach from getting too full. Eat slowly.   Do not lie down for 2 or 3 hours after eating. Do not eat or drink anything 1 to 2 hours before going to bed.   Avoid caffeine beverages (colas, coffee, cocoa, tea), fatty foods, citrus fruits and all other foods and drinks that contain acid and that seem to increase the problems.   Avoid bending over, especially after eating OR STRAINING. Anything that increases the pressure in your belly increases the amount of acid that may be pushed up into your esophagus.    High-Fiber Diet A high-fiber diet changes your normal diet to include more whole grains, legumes, fruits, and vegetables. Changes in the diet involve replacing refined carbohydrates with unrefined foods. The calorie level of the diet is essentially unchanged. The Dietary Reference Intake (recommended amount) for adult males is 38 grams per day. For adult females, it is 25 grams per day. Pregnant and  lactating women should consume 28 grams of fiber per day. Fiber is the intact part of a plant that is not broken down during digestion. Functional fiber is fiber that has been isolated from the plant to provide a beneficial effect in the body. PURPOSE  Increase stool bulk.   Ease and regulate bowel movements.   Lower cholesterol.  INDICATIONS THAT YOU NEED MORE FIBER  Constipation and hemorrhoids.   Uncomplicated diverticulosis (intestine condition) and irritable bowel syndrome.   Weight management.   As a protective measure against hardening of the arteries (atherosclerosis), diabetes, and cancer.   GUIDELINES FOR INCREASING FIBER IN THE DIET  Start adding fiber to the diet slowly. A gradual increase of about 5 more grams (2 slices of whole-wheat bread, 2 servings of most fruits or vegetables, or 1 bowl of high-fiber cereal) per day is best. Too rapid an increase in fiber may result in constipation, flatulence, and bloating.   Drink enough water and fluids to keep your urine clear or pale yellow. Water,  juice, or caffeine-free drinks are recommended. Not drinking enough fluid may cause constipation.   Eat a variety of high-fiber foods rather than one type of fiber.   Try to increase your intake of fiber through using high-fiber foods rather than fiber pills or supplements that contain small amounts of fiber.   The goal is to change the types of food eaten. Do not supplement your present diet with high-fiber foods, but replace foods in your present diet.  INCLUDE A VARIETY OF FIBER SOURCES  Replace refined and processed grains with whole grains, canned fruits with fresh fruits, and incorporate other fiber sources. White rice, white breads, and most bakery goods contain little or no fiber.   Brown whole-grain rice, buckwheat oats, and many fruits and vegetables are all good sources of fiber. These include: broccoli, Brussels sprouts, cabbage, cauliflower, beets, sweet potatoes, white  potatoes (skin on), carrots, tomatoes, eggplant, squash, berries, fresh fruits, and dried fruits.   Cereals appear to be the richest source of fiber. Cereal fiber is found in whole grains and bran. Bran is the fiber-rich outer coat of cereal grain, which is largely removed in refining. In whole-grain cereals, the bran remains. In breakfast cereals, the largest amount of fiber is found in those with "bran" in their names. The fiber content is sometimes indicated on the label.   You may need to include additional fruits and vegetables each day.   In baking, for 1 cup white flour, you may use the following substitutions:   1 cup whole-wheat flour minus 2 tablespoons.   1/2 cup white flour plus 1/2 cup whole-wheat flour.   Low-Fat Diet BREADS, CEREALS, PASTA, RICE, DRIED PEAS, AND BEANS These products are high in carbohydrates and most are low in fat. Therefore, they can be increased in the diet as substitutes for fatty foods. They too, however, contain calories and should not be eaten in excess. Cereals can be eaten for snacks as well as for breakfast.  Include foods that contain fiber (fruits, vegetables, whole grains, and legumes). Research shows that fiber may lower blood cholesterol levels, especially the water-soluble fiber found in fruits, vegetables, oat products, and legumes. FRUITS AND VEGETABLES It is good to eat fruits and vegetables. Besides being sources of fiber, both are rich in vitamins and some minerals. They help you get the daily allowances of these nutrients. Fruits and vegetables can be used for snacks and desserts. MEATS Limit lean meat, chicken, [place], and fish to no more than 6 ounces per day. Beef, Pork, and Lamb Use lean cuts of beef, pork, and lamb. Lean cuts include:  Extra-lean ground beef.  Arm roast.  Sirloin tip.  Center-cut ham.  Round steak.  Loin chops.  Rump roast.  Tenderloin.  Trim all fat off the outside of meats before cooking. It is not necessary  to severely decrease the intake of red meat, but lean choices should be made. Lean meat is rich in protein and contains a highly absorbable form of iron. Premenopausal women, in particular, should avoid reducing lean red meat because this could increase the risk for low red blood cells (iron-deficiency anemia). The organ meats, such as liver, sweetbreads, kidneys, and brain are very rich in cholesterol. They should be limited. Chicken and [place] These are good sources of protein. The fat of poultry can be reduced by removing the skin and underlying fat layers before cooking. Chicken and [place] can be substituted for lean red meat in the diet. Poultry should not be fried or  covered with high-fat sauces. Fish and Shellfish Fish is a good source of protein. Shellfish contain cholesterol, but they usually are low in saturated fatty acids. The preparation of fish is important. Like chicken and [place], they should not be fried or covered with high-fat sauces. EGGS Egg whites contain no fat or cholesterol. They can be eaten often. Try 1 to 2 egg whites instead of whole eggs in recipes or use egg substitutes that do not contain yolk. MILK AND DAIRY PRODUCTS Use skim or 1% milk instead of 2% or whole milk. Decrease whole milk, natural, and processed cheeses. Use nonfat or low-fat (2%) cottage cheese or low-fat cheeses made from vegetable oils. Choose nonfat or low-fat (1 to 2%) yogurt. Experiment with evaporated skim milk in recipes that call for heavy cream. Substitute low-fat yogurt or low-fat cottage cheese for sour cream in dips and salad dressings. Have at least 2 servings of low-fat dairy products, such as 2 glasses of skim (or 1%) milk each day to help get your daily calcium intake.  FATS AND OILS Reduce the total intake of fats, especially saturated fat. Butterfat, lard, and beef fats are high in saturated fat and cholesterol. These should be avoided as much as possible. Vegetable fats do not contain  cholesterol, but certain vegetable fats, such as coconut oil, palm oil, and palm kernel oil are very high in saturated fats. These should be limited. These fats are often used in bakery goods, processed foods, popcorn, oils, and nondairy creamers. Vegetable shortenings and some peanut butters contain hydrogenated oils, which are also saturated fats. Read the labels on these foods and check for saturated vegetable oils. Unsaturated vegetable oils and fats do not raise blood cholesterol. However, they should be limited because they are fats and are high in calories. Total fat should still be limited to 30% of your daily caloric intake. Desirable liquid vegetable oils are corn oil, cottonseed oil, olive oil, canola oil, safflower oil, soybean oil, and sunflower oil. Peanut oil is not as good, but small amounts are acceptable. Buy a heart-healthy tub margarine that has no partially hydrogenated oils in the ingredients. Mayonnaise and salad dressings often are made from unsaturated fats, but they should also be limited because of their high calorie and fat content. Seeds, nuts, peanut butter, olives, and avocados are high in fat, but the fat is mainly the unsaturated type. These foods should be limited mainly to avoid excess calories and fat. OTHER EATING TIPS Snacks  Most sweets should be limited as snacks. They tend to be rich in calories and fats, and their caloric content outweighs their nutritional value. Some good choices in snacks are graham crackers, melba toast, soda crackers, bagels (no egg), English muffins, fruits, and vegetables. These snacks are preferable to snack crackers, [place] fries, and chips. Popcorn should be air-popped or cooked in small amounts of liquid vegetable oil. Desserts Eat fruit, low-fat yogurt, and fruit ices. AVOID pastries, cake, and cookies. Sherbet, angel food cake, gelatin dessert, frozen low-fat yogurt, or other frozen products that do not contain saturated fat (pure fruit  juice bars, frozen ice pops) are also acceptable.  COOKING METHODS Choose those methods that use little or no fat. They include: Poaching.  Braising.  Steaming.  Grilling.  Baking.  Stir-frying.  Broiling.  Microwaving.  Foods can be cooked in a nonstick pan without added fat, or use a nonfat cooking spray in regular cookware. Limit fried foods and avoid frying in saturated fat. Add moisture to lean meats  by using water, broth, cooking wines, and other nonfat or low-fat sauces along with the cooking methods mentioned above. Soups and stews should be chilled after cooking. The fat that forms on top after a few hours in the refrigerator should be skimmed off. When preparing meals, avoid using excess salt. Salt can contribute to raising blood pressure in some people. EATING AWAY FROM HOME Order entres, potatoes, and vegetables without sauces or butter. When meat exceeds the size of a deck of cards (3 to 4 ounces), the rest can be taken home for another meal. Choose vegetable or fruit salads and ask for low-calorie salad dressings to be served on the side. Use dressings sparingly. Limit high-fat toppings, such as bacon, crumbled eggs, cheese, sunflower seeds, and olives. Ask for heart-healthy tub margarine instead of butter.  Hemorrhoids Hemorrhoids are dilated (enlarged) veins around the rectum. Sometimes clots will form in the veins. This makes them swollen and painful. These are called thrombosed hemorrhoids. Causes of hemorrhoids include:  Constipation.   Straining to have a bowel movement.   HEAVY LIFTING HOME CARE INSTRUCTIONS  Eat a well balanced diet and drink 6 to 8 glasses of water every day to avoid constipation. You may also use a bulk laxative.   Avoid straining to have bowel movements.   Keep anal area dry and clean.   Do not use a donut shaped pillow or sit on the toilet for long periods. This increases blood pooling and pain.   Move your bowels when your body has the  urge; this will require less straining and will decrease pain and pressure.

## 2013-09-01 NOTE — Progress Notes (Signed)
REVIEWED.  

## 2013-09-01 NOTE — Op Note (Signed)
[facility] [address] [place], [id number]   ENDOSCOPY PROCEDURE REPORT  PATIENT: Jeanette Cummings, Jeanette K.  MR#: [id number] BIRTHDATE: 1971/03/06 , 42  yrs. old GENDER: Female  ENDOSCOPIST: Jonette EvaSandi Estiven Kohan, MD REFERRED XB:JYNWBY:Zach Hall, M.D.  PROCEDURE DATE: 09/01/2013 PROCEDURE:   EGD w/ biopsy  INDICATIONS:Nausea. USES IBUPROFEN & NAPROXEN. TAKING PREVACID 15 MG PRN. MEDICATIONS: TCS+ Demerol 50 mg IV TOPICAL ANESTHETIC:   Cetacaine Spray  DESCRIPTION OF PROCEDURE:     Physical exam was performed.  Informed consent was obtained from the patient after explaining the benefits, risks, and alternatives to the procedure.  The patient was connected to the monitor and placed in the left lateral position.  Continuous oxygen was provided by nasal cannula and IV medicine administered through an indwelling cannula.  After administration of sedation, the patients esophagus was intubated and the EG-[id number] ([id number]  endoscope was advanced under direct visualization to the second portion of the duodenum.  The scope was removed slowly by carefully examining the color, texture, anatomy, and integrity of the mucosa on the way out.  The patient was recovered in endoscopy and discharged home in satisfactory condition.   ESOPHAGUS: The mucosa of the esophagus appeared normal.   A small hiatal hernia was noted.   STOMACH: Moderate non-erosive gastritis (inflammation) was found in the gastric antrum.  Multiple biopsies were performed.   DUODENUM: The duodenal mucosa showed no abnormalities in the bulb and second portion of the duodenum. COMPLICATIONS:   None  ENDOSCOPIC IMPRESSION: 1.   NAUSEA MOST LIKELY DUE TO GASTRTITIS, LESS LIKELY GERD 2.   Small hiatal hernia 3.   MILD Non-erosive gastritis  RECOMMENDATIONS: INCREASE PREVACID.  TAKE 30 MINUTES PRIOR TO MEALS TWICE DAILY FOR 3 MOS THEN ONCE DAILY. DO NOT TAKE NAPROXEN AND IBUPROFEN AT THE SAME TIME.  IT WILL CAUSE KIDNEY  FAILURE. AVOID ITEMS THAT TRIGGER GASTRITIS. FOLLOW A HIGH FIBER/LOW FAT DIET.  AVOID ITEMS THAT CAUSE BLOATING.  CONTINUE YOUR WEIGHT LOSS EFFORTS TO DECREASE RISK FOR COLON CANCER. BIOPSY RESULTS WILL BE BACK IN 7 DAYS. FOLLOW UP IN 3 MOS. NEXT COLONOSCOPY IN 5-10 YEARS.   REPEAT EXAM:   _______________________________ Rosalie DoctoreSignedJonette Eva:  Eriyonna Matsushita, MD 09/01/2013 11:24 AM       PATIENT NAME:  Jeanette Cummings, Jeanette K. MR#: [id number]

## 2013-09-01 NOTE — Op Note (Addendum)
[facility] [address] [place], [id number]   COLONOSCOPY PROCEDURE REPORT  PATIENT: Jeanette Cummings, [name]  MR#: [id number] BIRTHDATE: Feb 06, 1971 , 42  yrs. old GENDER: Female ENDOSCOPIST: Jonette EvaSandi Fields, MD REFERRED AO:ZHYQBY:Zach Hall, M.D. PROCEDURE DATE:  09/01/2013 PROCEDURE:   Colonoscopy, diagnostic INDICATIONS:Rectal Bleeding. MEDICATIONS: Demerol 125 mg IV and Versed 10 mg IV  DESCRIPTION OF PROCEDURE:    Physical exam was performed.  Informed consent was obtained from the patient after explaining the benefits, risks, and alternatives to procedure.  The patient was connected to monitor and placed in left lateral position. Continuous oxygen was provided by nasal cannula and IV medicine administered through an indwelling cannula.  After administration of sedation and rectal exam, the patients rectum was intubated and the EC-[id number] ([id number]  colonoscope was advanced under direct visualization to the ileum.  The scope was removed slowly by carefully examining the color, texture, anatomy, and integrity mucosa on the way out.  The patient was recovered in endoscopy and discharged home in satisfactory condition.    COLON FINDINGS: The mucosa appeared normal in the terminal ileum.  , The colon was redundant.  The patient was moved on to their back to reach the cecum, The colon mucosa was otherwise normal.  , and Small internal hemorrhoids were found.  PREP QUALITY: good. CECAL W/D TIME: 10 minutes     COMPLICATIONS: None  ENDOSCOPIC IMPRESSION: 1.   Normal mucosa in the terminal ileum 2.   The colon IS redundant 3.   RECTAL BLEEDING DUE TO  Small internal hemorrhoids  RECOMMENDATIONS: HIGH FIBER DIET TCS IN 5-10 YEARS WITH AN OVERTUBE       _______________________________ Rosalie DoctoreSignedJonette Eva:  Sandi Fields, MD 09/01/2013 11:25 AM Revised: 09/01/2013 11:25 AM

## 2013-09-05 ENCOUNTER — Encounter (HOSPITAL_COMMUNITY): Payer: Self-pay | Admitting: Gastroenterology

## 2013-09-06 NOTE — Progress Notes (Signed)
Labs dated 08/11/2013 Sodium 135, potassium 4.2, BUN 14, creatinine 0.81, total bilirubin 0.3, alkaline phosphatase 82, AST 16, ALT 20, albumin 4.2, calcium 9.8, white blood cell count 7900, hemoglobin 13.2, hematocrit 38.6, MCV 84.5, platelets 280,000, TSH 1.657, hemoglobin A1c 5.4

## 2013-09-07 LAB — CBC
HCT: 39 %
HEMOGLOBIN: 13.2 g/dL
Hgb A1c MFr Bld: 5.4 % (ref 4.0–6.0)
MCV: 84.5 fL
PLATELET COUNT: 280
TSH: 1.66 u[IU]/mL (ref 0.41–5.90)
WBC: 7.9

## 2013-09-07 LAB — COMPREHENSIVE METABOLIC PANEL
ALT: 20 U/L (ref 7–35)
AST: 16 U/L
Albumin: 4.2
Alkaline Phosphatase: 82 U/L
BILIRUBIN TOTAL: 0.3 mg/dL (ref 0.1–1.4)
BUN: 14 mg/dL (ref 4–21)
Calcium: 9.8 mg/dL
Creat: 0.81
Potassium: 4.2 mmol/L
SODIUM: 138 mmol/L (ref 137–147)

## 2013-09-08 ENCOUNTER — Telehealth: Payer: Self-pay | Admitting: Gastroenterology

## 2013-09-08 NOTE — Telephone Encounter (Signed)
Please call pt. HER stomach Bx mild gastritis.  INCREASE PREVACID. TAKE 30 MINUTES PRIOR TO MEALS TWICE DAILY. DO NOT TAKE NAPROXEN AND IBUPROFEN AT THE SAME TIME.  AVOID ITEMS THAT TRIGGER GASTRITIS.  FOLLOW A HIGH FIBER/LOW FAT DIET. AVOID ITEMS THAT CAUSE BLOATING.  CONTINUE YOUR WEIGHT LOSS EFFORTS. FOLLOW UP IN 3 MOS E30 NAUSEA/GERD. NEXT TCS 5-10 YEARS DUE TO MOTHER WITH METASTATIC CANCER TO BRAIN UNKNOWN PRIMARY.

## 2013-09-11 NOTE — Telephone Encounter (Signed)
Pt called and was informed of results.  

## 2013-09-11 NOTE — Telephone Encounter (Signed)
Reminder in EPIC 

## 2013-09-11 NOTE — Telephone Encounter (Signed)
LMOM for a return call on mobile. ( Home number not working).

## 2013-12-11 ENCOUNTER — Encounter: Payer: Self-pay | Admitting: Gastroenterology

## 2014-01-30 ENCOUNTER — Ambulatory Visit (INDEPENDENT_AMBULATORY_CARE_PROVIDER_SITE_OTHER): Payer: BC Managed Care – PPO | Admitting: Gastroenterology

## 2014-01-30 ENCOUNTER — Encounter: Payer: Self-pay | Admitting: Gastroenterology

## 2014-01-30 VITALS — BP 119/86 | HR 75 | Temp 98.3°F | Resp 18 | Ht 65.0 in | Wt 231.0 lb

## 2014-01-30 DIAGNOSIS — K219 Gastro-esophageal reflux disease without esophagitis: Secondary | ICD-10-CM

## 2014-01-30 DIAGNOSIS — K5900 Constipation, unspecified: Secondary | ICD-10-CM

## 2014-01-30 DIAGNOSIS — K625 Hemorrhage of anus and rectum: Secondary | ICD-10-CM

## 2014-01-30 MED ORDER — LANSOPRAZOLE 30 MG PO CPDR: 30.0000 mg | DELAYED_RELEASE_CAPSULE | Freq: Every day | ORAL | Status: DC

## 2014-01-30 MED ORDER — HYDROCORTISONE 2.5 % RE CREA: 1.0000 "application " | TOPICAL_CREAM | Freq: Two times a day (BID) | RECTAL | Status: DC

## 2014-01-30 MED ORDER — LINACLOTIDE 145 MCG PO CAPS: 145.0000 ug | ORAL_CAPSULE | Freq: Every day | ORAL | Status: DC

## 2014-01-30 NOTE — Progress Notes (Signed)
cc'd to pcp 

## 2014-01-30 NOTE — Progress Notes (Signed)
      Primary Care Physician: Catalina PizzaHALL, ZACH, MD  Primary Gastroenterologist:  Jonette EvaSandi Fields, MD  Chief Complaint  Patient presents with  . Follow-up    HPI: Jeanette Cummings is a 43 y.o. female here for three-month followup. She underwent an EGD and colonoscopy previously for nausea, rectal bleeding, significant family history of malignancies. She had small internal hemorrhoids, mild benign gastritis. Advised to stop naproxen and ibuprofen.  Does well with heartburn as long as stays on PPI. Currently taking Prevacid over-the-counter once daily. Never went and got the prescription strength. Over-the-counter extensive, $1 per pill. No BM without take something and associated with brbpr with hard stool. Bisacodyl takes day or two to work. Miralax makes very bloated and doesn't work very well. Denies any abdominal pain, dysphagia, vomiting  Current Outpatient Prescriptions  Medication Sig Dispense Refill  . acetaminophen (TYLENOL) 500 MG tablet Take 1,000 mg by mouth every 6 (six) hours as needed.      . calcium carbonate 1250 MG capsule Take 1,250 mg by mouth 2 (two) times daily with a meal.      . calcium-vitamin D (OSCAL WITH D) 250-125 MG-UNIT per tablet Take 1 tablet by mouth 2 (two) times daily.      Marland Kitchen. ibuprofen (ADVIL,MOTRIN) 600 MG tablet Take 600 mg by mouth 2 (two) times daily as needed for moderate pain.       Marland Kitchen. lansoprazole (PREVACID) 30 MG capsule 1 po 30 mins prior to meals bid for 3 mos then once daily  60 capsule  11  . Multiple Vitamin (MULTIVITAMIN) capsule Take 1 capsule by mouth daily.      . norethindrone-ethinyl estradiol-iron (JUNEL FE 1.5/30) 1.5-30 MG-MCG tablet Take 1 tablet by mouth daily.      Bertram Gala. Polyethyl Glycol-Propyl Glycol (SYSTANE OP) Place 1-2 drops into both eyes every 4 (four) hours as needed (dry eyes).      . topiramate (TOPAMAX) 100 MG tablet Take 125 mg by mouth daily.       No current facility-administered medications for this visit.    Allergies as of  01/30/2014  . (No Known Allergies)    ROS:  General: Negative for anorexia, weight loss, fever, chills, fatigue, weakness. ENT: Negative for hoarseness, difficulty swallowing , nasal congestion. CV: Negative for chest pain, angina, palpitations, dyspnea on exertion, peripheral edema.  Respiratory: Negative for dyspnea at rest, dyspnea on exertion, cough, sputum, wheezing.  GI: See history of present illness. GU:  Negative for dysuria, hematuria, urinary incontinence, urinary frequency, nocturnal urination.  Endo: Negative for unusual weight change.    Physical Examination:   BP 119/86  Pulse 75  Temp(Src) 98.3 F (36.8 C) (Oral)  Resp 18  Ht 5\' 5"  (1.651 m)  Wt 231 lb (104.781 kg)  BMI 38.44 kg/m2  General: Well-nourished, well-developed in no acute distress.  Eyes: No icterus. Mouth: Oropharyngeal mucosa moist and pink , no lesions erythema or exudate. Lungs: Clear to auscultation bilaterally.  Heart: Regular rate and rhythm, no murmurs rubs or gallops.  Abdomen: Bowel sounds are normal, nontender, nondistended, no hepatosplenomegaly or masses, no abdominal bruits or hernia , no rebound or guarding.   Extremities: No lower extremity edema. No clubbing or deformities. Neuro: Alert and oriented x 4   Skin: Warm and dry, no jaundice.   Psych: Alert and cooperative, normal mood and affect.

## 2014-01-30 NOTE — Assessment & Plan Note (Signed)
Related to hemorrhoids in the setting of chronic constipation.Add Linzess daily. Voucher and RX provided. Patients symptoms likely aggravated by calcium supplementation required. Continue high fiber diet. Congratulated on weight loss efforts. anusol cream BID X 2 weeks.   Discussed utility of [facility] banding for internal hemorrhoids. Brochure provided. She will call if decides to pursue. Otherwise, OV in next 2 years for RX renewal or sooner if needed.

## 2014-01-30 NOTE — Assessment & Plan Note (Signed)
Doing well on PPI therapy. Prescription for lansoprazole 30 mg daily sent to pharmacy. Patient will let us know if too expensive.

## 2014-01-30 NOTE — Patient Instructions (Signed)
1. Prescription for generic Prevacid sent to your pharmacy. Take 1 daily before breakfast. This takes place and over-the-counter Prevacid. 2. Start Linzess 1 daily before breakfast for constipation. Voucher for 30 day supply provided, you must take your pharmacy. Prescription with refills sent to your pharmacy as well. 3. Anusol cream apply rectally twice daily for hemorrhoids. If medication is too expensive, by over-the-counter preparation H or equivalent. 4. Please return to office as needed. If you decide to have hemorrhoid banding, you will need to make appointment with Dr. Delfino LovettFields ONLY and let the receptionist know you want to have your hemorrhoids banded. That way you will have the appropriate appointment scheduled.

## 2014-01-31 ENCOUNTER — Encounter: Payer: Self-pay | Admitting: Gastroenterology

## 2014-01-31 ENCOUNTER — Other Ambulatory Visit (HOSPITAL_COMMUNITY): Payer: Self-pay | Admitting: Internal Medicine

## 2014-01-31 DIAGNOSIS — Z1231 Encounter for screening mammogram for malignant neoplasm of breast: Secondary | ICD-10-CM

## 2014-02-01 ENCOUNTER — Other Ambulatory Visit (HOSPITAL_COMMUNITY): Payer: Self-pay | Admitting: Internal Medicine

## 2014-02-01 DIAGNOSIS — N939 Abnormal uterine and vaginal bleeding, unspecified: Secondary | ICD-10-CM

## 2014-02-01 DIAGNOSIS — R102 Pelvic and perineal pain: Secondary | ICD-10-CM

## 2014-02-02 ENCOUNTER — Other Ambulatory Visit (HOSPITAL_COMMUNITY): Payer: Self-pay | Admitting: Internal Medicine

## 2014-02-02 DIAGNOSIS — R102 Pelvic and perineal pain: Secondary | ICD-10-CM

## 2014-02-02 DIAGNOSIS — N939 Abnormal uterine and vaginal bleeding, unspecified: Secondary | ICD-10-CM

## 2014-02-05 ENCOUNTER — Ambulatory Visit (HOSPITAL_COMMUNITY)
Admission: RE | Admit: 2014-02-05 | Discharge: 2014-02-05 | Disposition: A | Discharge status: Home / Self Care | Payer: BC Managed Care – PPO | Source: Ambulatory Visit | Attending: Internal Medicine | Admitting: Internal Medicine

## 2014-02-05 ENCOUNTER — Ambulatory Visit (HOSPITAL_COMMUNITY): Payer: BC Managed Care – PPO

## 2014-02-05 DIAGNOSIS — R9389 Abnormal findings on diagnostic imaging of other specified body structures: Secondary | ICD-10-CM | POA: Insufficient documentation

## 2014-02-05 DIAGNOSIS — N939 Abnormal uterine and vaginal bleeding, unspecified: Secondary | ICD-10-CM

## 2014-02-05 DIAGNOSIS — N949 Unspecified condition associated with female genital organs and menstrual cycle: Secondary | ICD-10-CM | POA: Insufficient documentation

## 2014-02-05 DIAGNOSIS — R102 Pelvic and perineal pain: Secondary | ICD-10-CM

## 2014-02-06 ENCOUNTER — Other Ambulatory Visit (HOSPITAL_COMMUNITY): Payer: Self-pay | Admitting: Internal Medicine

## 2014-02-06 DIAGNOSIS — Z139 Encounter for screening, unspecified: Secondary | ICD-10-CM

## 2014-02-08 ENCOUNTER — Ambulatory Visit (HOSPITAL_COMMUNITY): Payer: BC Managed Care – PPO

## 2014-02-20 ENCOUNTER — Ambulatory Visit (HOSPITAL_COMMUNITY): Payer: BC Managed Care – PPO

## 2014-02-20 ENCOUNTER — Other Ambulatory Visit (HOSPITAL_COMMUNITY): Payer: BC Managed Care – PPO

## 2014-02-22 ENCOUNTER — Ambulatory Visit (HOSPITAL_COMMUNITY)
Admission: RE | Admit: 2014-02-22 | Discharge: 2014-02-22 | Disposition: A | Discharge status: Home / Self Care | Payer: BC Managed Care – PPO | Source: Ambulatory Visit | Attending: Internal Medicine | Admitting: Internal Medicine

## 2014-02-22 DIAGNOSIS — Z139 Encounter for screening, unspecified: Secondary | ICD-10-CM | POA: Insufficient documentation

## 2014-02-22 DIAGNOSIS — Z1231 Encounter for screening mammogram for malignant neoplasm of breast: Secondary | ICD-10-CM

## 2014-06-27 ENCOUNTER — Encounter (HOSPITAL_COMMUNITY): Payer: Self-pay | Admitting: Physical Therapy

## 2014-06-27 ENCOUNTER — Ambulatory Visit (HOSPITAL_COMMUNITY)
Admission: RE | Admit: 2014-06-27 | Discharge: 2014-06-27 | Disposition: A | Discharge status: Home / Self Care | Payer: BC Managed Care – PPO | Source: Ambulatory Visit | Attending: Internal Medicine | Admitting: Internal Medicine

## 2014-06-27 DIAGNOSIS — M25561 Pain in right knee: Secondary | ICD-10-CM

## 2014-06-29 NOTE — Addendum Note (Signed)
Encounter addended by: Bella Kennedyynthia J Tyreon Frigon, PT on: 06/29/2014 11:19 AM<BR>     Documentation filed: Clinical Notes

## 2014-06-29 NOTE — Therapy (Signed)
[facility] [address] [place], [place], [address] Phone: [phone]   Fax:  [phone]  Physical Therapy Evaluation  Patient Details  Name: Jeanette Cummings MRN: [id number] Date of Birth: 11-Jul-1971  Encounter Date: 06/27/2014    Past Medical History  Diagnosis Date  . Hyperthyroidism   . Autoimmune disease     large family history  . Frequent headaches   . History of cervical biopsy     multiple  . Multiple thyroid nodules     Past Surgical History  Procedure Laterality Date  . Thymectomy  03/09/05  . Foot surgery      both  . Wisdom tooth extraction    . Parathyroidectomy  2014  . History of thyroid radiation  2008  . Colonoscopy N/A 09/01/2013    Dr. Oneida Alar- normal mucosa in the terminal ileum. the colon is redundant, small internal hemorrhoids.  . Esophagogastroduodenoscopy N/A 09/01/2013    Dr.Fields- esophagus appeared normal, small hiatal hernia, moderate non-erosive gastritis found in gastric antrum. duodenum= no abnormalities bx= minimal chronic infammation.    There were no vitals taken for this visit.  Visit Diagnosis:  Leg Pain   [facility]  [address] [place], [place]. [address]  [phone]/AGT: [phone]  FUNCTIONAL CAPACITY EVALUATION (FCE) SUMMARY SHEET   CLIENT:  Jeanette Cummings        DATE OF ATTENDANCE: 06/27/2014  EVALUATOR: Azucena Freed PT   OCCUPATION at TIME of INJURY:  N/A  EMPLOYER at TIME of INJURY:  N/A  PRIMARY PHYSICIAN: Dr. Nevada Crane  DATE of INJURY:  N/A   DIAGNOSIS:  LE weakness   INVOLVED EXTREMITY:  B LE  DOMINANT HAND:  Rt  REFERRED BY: Dr. Delphina Cahill     Thank you for the referral of this client to [facility] for a Functional Capacity Evaluation.   STATEMENT OF PURPOSE:  The purpose of this evaluation is to determine Jeanette Cummings functional ability and establish her safe physical tolerance level.     RECOMMENDATIONS:   Based on the Functional Capacity Evaluation the client demonstrated the ability to perform at a light physical demand level for occasional lifting (lifting 20 lb.  occasionally from 12' to waist level.  The client self-terminated sitting after 21 minutes and 30 seconds secondary to complaining of B LE numbing.  Non-material handling activities produced complaints of increased back and leg pain as well as weakness in her lower extremities.  Statistical data suggests that the client many not have provided maximal effort throughout the entire testing.  [place] non-organic signs demonstrated negative findings with 2 out of 5 behaviors being met as cited in physical therapy article titled, Screening for psychological factors in patients with low back problems:  Waddell's non-organic signs, March 1997.   A job description is not available that reflects the client's required work tasks or physical demand requirements.  Therefore the following work recommendations are provided: 1.  Maintain a light physical demand level for occasional lifting, lowering, carrying, and pushing /pulling.  2. Complete the following activities on an occasional basis:  squatting, sitting, and stair climbing. 3. Complete the following activities on a frequent basis:  bending, kneeling, crawling, balancing, reaching above shoulder height, standing, walking, alternate sit/stand, hand and foot control.   The client demonstrates significant symptom response to all activities and weakness/ tingling in her legs as well as tingling in her arms and hands.  It  may be in the client's best interest to attend a combination of formal physical and occupational therapy to decrease the client's symptom response to mobility and increase her strength and endurance.  This is subject to Dr. Juel Burrow approval.     BACKGROUND:   Jeanette Cummings is a 43 year old right-handed female who worked as a Marine scientist for [employer]. She  reports that she had a thyroidectomy in 2005.  In 2013 she developed severe bone and leg pain.  She continued to work April of 2014.  She began having high levels of calcium which was attributed to her parathyroid and she opted to have a parathyroidectomy in August of 2014.This however, did not decrease her symptoms of neck, back and leg pain as well as tingling in her arms and legs. She is currently being seen by two neurologist's one in [place] and the other at [facility]. She states that her symptoms at this point include memory loss, headaches, pain, tingling, and decrease balance but she has never tried therapy to try and decrease these symptoms.                         SUMMARY: For your quick reference, please note the following highlights of the evaluation.  A complete narrative summary with recommendations follows:  1. CLIENT'S CURRENT PHYSICAL CAPACITY LEVEL:  Light  2. BODY MECHANICS:  Fair  3. SYMPTOM RESPONSE:  On a 0-10 scale before testing the client reported her pain level as a 3/10 after testing she reported her pain at a 7/10.   4. EFFORT:  Statistical data shows that the client may not have given maximum effort throughout the test.  Waddell's signs demonstrated negative findings.       5. Upper Extremity: Range of motion was within functional level but the client demonstrated decreased strength bilaterally.  Please see details following.  6. Lower extremity:  Range of motion was within functional level with decreased strength bilaterally.   7. Trunk:  Demonstrates decreased Range of motion as well as strength.     Jeanette Cummings attended her Functional Capacity Evaluation.  She performed or attempted to perform all activities as requested.    Based on observation of Jeanette Cummings performance during the evaluation her primary debilitation appears to be strength and endurance.    If you have any further questions in regards to the results of this evaluation, do not hesitate to call me  at [phone].      ______________________ Azucena Freed, P.T.   Name:  Jeanette Cummings  Date:  06/27/2014  Doctor:  Delphina Cahill    MATERIAL HANDLING ACTIVITIES   Occasional Frequent Constant   1-33% 34 - 66% 67 - 100%  Lifting From:     Floor to Knuckle height  21#  10.5# 4#  12" Floor to Knuckle 21# 10.5# 4#  Knuckle to Shoulder Height 15# 7.5# 3#  Shoulder to Overhead 13# 6.5# 2.5#  Worker can Pull Torque of 20# Torque of 10# Torque of 4#  Worker can Push  Torque of 20# Torque of 10# Torque of 4#  Worker can carry up to 100 Feet 18# 9# 3.5#    NON-MATERIAL HANDLING ACTIVITIES   Occasional 1-33% Frequent 34-66% Constant 64-100% Comment  Bend  x  Completed 20 of 20 required repeat ions with complaint of slight dizziness.   Squat x   Completed 11 of 15 required repetitions with complaint of knee and back pain. Pt  needed to use one hand assist to return from squatted position.   Kneel  x  Completed 5 minutes of the 5 minute requirement with complaint of knee and back pain.  Balance - 25 ft.  x  Completed 25 feet client became off balanced but was able to self -recover before stepping off line.   Reaching above shoulder height  x  Completed five minutes of the five minute task complaining of increased shoulder and neck pain.   Sitting x   Completed 21:30 minutes of the 30:00 requirement complaining of B numbness in her legs.   Standing  x  Completed 30 minutes of the 30 minute task complaining of B hip pain.   Walking  x  Completed 25 minutes of the 25 minute task complaining of increased leg pain as well as dizziness (client walked on a treadmill).  Alternative sit/stand  x    Hand control  x  Used dynamometer but was shaking hands after use.   Foot control  x  Able to control ankle biomechanics proprioception board but complained of feet going numb.   Stair climbing x   Able to ascend and descend 48 of the 60 required steps.  Client used handrails and was going slow   Crawling                                     x                                    crawled 25 ft. slowly          Hand Dominance -   The client completed a J-Mar dynamometer testing should whether you have Pathology or not complete a Bell-shaped curve.        The results of this test are as follows:   JAMAR GRIP STRENGTH        Left  Right    Level I  60#                 48#    Level II  70#                 50#    Level III  70#                 50#    Level IV  43#                 45#    Level V  45#                 43#   No matter if there is pathology or not if consistent effort is given the jamar grip strength should make a bell shape curve.   Pt fast exchange was 80# bilaterally on level 2.  A static grip strength should be higher than a fast exchange.      EXTREMITY TESTING   Lower extremity:  Range of motion was within functional limits.  Strength is as follows:   Upper Extremities: Left Right Normal  ROM - was within normal limits     STRENGTH -  Shoulder flexors 3+/5 3+/5                   Extensors  4/5 4/5  Abductors  3+/5 3+/5                   Adductors 5-/5 5-/5                   Horizontal  AB 5/5 5-/5                   Horizontal Add 5/5 5/5                   Internal Rotator 5/5 5/5                   External Rotator 3+/5 3+/5   Elbow Flexors 5-/5 5-/5              Extensors  5-/5 5-/5   Wrist  5-/5 5-/5   Lower Extremities:     ROM was within functional limits     Strength - as follows:     Hip flexors 3+/5 4/5   Extensors 3+/5 3+/5   Abductors 4-/5 4/5   Adductors 3+/5 3+/5   Knee flexors 3+/5 3+/5   Extensors 5/5 5-/5   Ankle dorsi flexors 4/5 5/5              evertors  3+/5 3+/5              Invertors  4/5 3+/5   Cervical:     ROM within functional limits      Trunk:     ROM:  flexion 95 degrees -79% of normal; extension 30 degrees-85% of normal Flexor strength 3/5;  Extensor strength 2+/5          ENDURANCE  TESTING  Submaximal Cardiac Stress Test:    In this test the patient walks on a treadmill for four minutes and then the grade is increased to 5% for four minutes.  The end result for Jeanette Cummings was that her maximum Heart rate at 2.0 mph was at 121.   Max Met level 6.49 with a safe working Met level of 2.6 which is at a light physical demand category for cardiovascular ability.    Work Geneticist, molecular:  During this test the client is required to do five tasks in a one minute period of time.  The client then repeats this circuit for 30 repetitions.  The tasks were as follows:  Lift ten pounds from 12" from the floor to waist level. Lift eight pounds from waist level to shoulder level. Carry ten pounds for 40 ft.  Push a sled using 20 # torque for 19f.  Pull a sled using 20# torque for 20 ft.   The client was able to complete this for 30 repetitions.  Her end heart rate was 110 with a 3-minute recovery down to 92 and five minute recovery down to 82.  The patient perceived Borg rate with this activity was 17 which is rather high when compared to her heart rate.  This could be due to decreased cardiovascular fitness as well as cumulative trauma affect.      This concludes the FCE on Jeanette Cummings   Thank You,  __________________________________ CAzucena FreedPT/CLT ([phone]                                     Problem List Patient Active Problem List   Diagnosis Date Noted  . Unspecified constipation 01/30/2014  .  Rectal bleeding 08/16/2013  . Heme positive stool 08/16/2013  . GERD (gastroesophageal reflux disease) 08/16/2013  . Nausea alone 08/16/2013  . FH: colon cancer 08/16/2013    Jeanette Cummings,Jeanette Cummings 06/29/2014, 11:13 AM

## 2014-11-26 ENCOUNTER — Other Ambulatory Visit (HOSPITAL_COMMUNITY): Payer: Self-pay | Admitting: Internal Medicine

## 2014-11-26 DIAGNOSIS — E329 Disease of thymus, unspecified: Secondary | ICD-10-CM

## 2014-11-29 ENCOUNTER — Ambulatory Visit (HOSPITAL_COMMUNITY)
Admission: RE | Admit: 2014-11-29 | Discharge: 2014-11-29 | Disposition: A | Discharge status: Home / Self Care | Payer: BLUE CROSS/BLUE SHIELD | Source: Ambulatory Visit | Attending: Internal Medicine | Admitting: Internal Medicine

## 2014-11-29 DIAGNOSIS — M6281 Muscle weakness (generalized): Secondary | ICD-10-CM | POA: Diagnosis not present

## 2014-11-29 DIAGNOSIS — E329 Disease of thymus, unspecified: Secondary | ICD-10-CM | POA: Insufficient documentation

## 2014-11-29 MED ORDER — IOHEXOL 300 MG/ML  SOLN
80.0000 mL | Freq: Once | INTRAMUSCULAR | Status: AC | PRN
  Administered 2014-11-29: 80 mL via INTRAVENOUS

## 2015-03-08 ENCOUNTER — Other Ambulatory Visit: Payer: Self-pay | Admitting: Gastroenterology

## 2015-03-19 NOTE — Progress Notes (Signed)
REVIEWED-NO ADDITIONAL RECOMMENDATIONS. 

## 2015-04-05 ENCOUNTER — Other Ambulatory Visit: Payer: Self-pay

## 2015-04-05 ENCOUNTER — Encounter: Payer: Self-pay | Admitting: Gastroenterology

## 2015-04-05 ENCOUNTER — Ambulatory Visit (INDEPENDENT_AMBULATORY_CARE_PROVIDER_SITE_OTHER): Payer: BLUE CROSS/BLUE SHIELD | Admitting: Gastroenterology

## 2015-04-05 VITALS — BP 105/70 | HR 74 | Temp 97.5°F | Ht 65.0 in | Wt 241.2 lb

## 2015-04-05 DIAGNOSIS — R1314 Dysphagia, pharyngoesophageal phase: Secondary | ICD-10-CM

## 2015-04-05 DIAGNOSIS — K219 Gastro-esophageal reflux disease without esophagitis: Secondary | ICD-10-CM

## 2015-04-05 DIAGNOSIS — K5900 Constipation, unspecified: Secondary | ICD-10-CM

## 2015-04-05 DIAGNOSIS — R1310 Dysphagia, unspecified: Secondary | ICD-10-CM | POA: Insufficient documentation

## 2015-04-05 NOTE — Progress Notes (Signed)
Referring Provider: Catalina Pizza, MD Primary Care Physician:  Glori Bickers, MD  Chief Complaint  Patient presents with  . Dysphagia  . Abdominal Pain    HPI:   Jeanette Cummings is a 44 y.o. female presenting today with a history of GERD, constipation. Recent colonoscopy/EGD on file.   Constantly feels like something is in her throat, has to swallow multiple times. Some mild food dysphagia but moreso pill dysphagia. Present for a few months. Occasional pain swallowing. Lots of heartburn. Prevacid for reflux. Used to take twice a day but now has been decreased to once a day. Insurance wouldn't approve. Has tried other PPIs to include Prilosec, Protonix. 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   Sometimes feels like just a pit/knot in her stomach. Tried Linzess in the past but stomach would bloat up and cause severe pain. Now takes an OTC agent (Senna/Docusate). Sometimes stomach will bloat up and get really hard. Stool not productive. Fiber causes bloating. Has tried Miralax a few times but still has stomach bloating with this. No probiotic.   Past Medical History  Diagnosis Date  . Hyperthyroidism   . Autoimmune disease     large family history  . Frequent headaches   . History of cervical biopsy     multiple  . Multiple thyroid nodules     Past Surgical History  Procedure Laterality Date  . Thymectomy  03/09/05  . Foot surgery      both  . Wisdom tooth extraction    . Parathyroidectomy  2014  . History of thyroid radiation  2008  . Colonoscopy N/A 09/01/2013    Dr. Darrick Penna- normal mucosa in the terminal ileum. the colon is redundant, small internal hemorrhoids.  . Esophagogastroduodenoscopy N/A 09/01/2013    Dr.Fields- esophagus appeared normal, small hiatal hernia, moderate non-erosive gastritis found in gastric antrum. duodenum= no abnormalities bx= minimal chronic infammation.    Current Outpatient Prescriptions  Medication Sig Dispense Refill  . acetaminophen (TYLENOL) 500 MG  tablet Take 1,000 mg by mouth every 6 (six) hours as needed.    . calcium-vitamin D (OSCAL WITH D) 250-125 MG-UNIT per tablet Take 1 tablet by mouth 2 (two) times daily.    . Cholecalciferol (VITAMIN D) 2000 UNITS tablet Take 2,000 Units by mouth daily.    Marland Kitchen ibuprofen (ADVIL,MOTRIN) 600 MG tablet Take 600 mg by mouth 2 (two) times daily as needed for moderate pain.     Marland Kitchen lansoprazole (PREVACID) 30 MG capsule TAKE 1 CAPSULE (30 MG TOTAL) BY MOUTH DAILY BEFORE BREAKFAST. 30 capsule 5  . norethindrone-ethinyl estradiol-iron (JUNEL FE 1.5/30) 1.5-30 MG-MCG tablet Take 1 tablet by mouth daily.    Bertram Gala Glycol-Propyl Glycol (SYSTANE OP) Place 1-2 drops into both eyes every 4 (four) hours as needed (dry eyes).    . topiramate (TOPAMAX) 100 MG tablet Take 125 mg by mouth daily.    . verapamil (VERELAN PM) 240 MG 24 hr capsule Take by mouth.    Marland Kitchen amitriptyline (ELAVIL) 25 MG tablet Take by mouth.    . calcium carbonate 1250 MG capsule Take 1,250 mg by mouth 2 (two) times daily with a meal.    . hydrocortisone (ANUSOL-HC) 2.5 % rectal cream Place 1 application rectally 2 (two) times daily. For 2 weeks. (Patient not taking: Reported on 06/27/2014) 30 g 0   No current facility-administered medications for this visit.    Allergies as of 04/05/2015  . (No Known Allergies)    Family History  Problem  Relation Age of Onset  . Colon polyps Mother     numerous  . Colon cancer Maternal Aunt     diagnosed at age 46, deceased  . Colon cancer Cousin     diagnosed at age 41, deceased age 43  . Colon polyps Cousin     precancerous, age 37s  . Breast cancer Mother     age 16s  . Breast cancer Maternal Aunt     age 76, at age 5 new primry lung cancer deceased. Sjogrens', polymyositis, hyperthyroid, scleroderma  . Breast cancer Maternal Aunt     late 74s  . Ovarian cancer Paternal Grandmother   . Ovarian cancer Cousin     age 3, paternal  . Cancer Mother     died from metastatic brain cancer, not  from breast cancer, unknown primary  . Lung cancer Maternal Grandmother     died age 80  . Other Son     IgA/IgE deficient  . Colon cancer Mother     Social History   Social History  . Marital Status: Married    Spouse Name: N/A  . Number of Children: 2  . Years of Education: N/A   Occupational History  .      Social History Main Topics  . Smoking status: Never Smoker   . Smokeless tobacco: None  . Alcohol Use: No  . Drug Use: No  . Sexual Activity: Not Asked   Other Topics Concern  . None   Social History Narrative    Review of Systems: Negative unless mentioned in HPI.   Physical Exam: BP 105/70 mmHg  Pulse 74  Temp(Src) 97.5 F (36.4 C) (Oral)  Ht 5\' 5"  (1.651 m)  Wt 241 lb 3.2 oz (109.408 kg)  BMI 40.14 kg/m2 General:   Alert and oriented. No distress noted. Pleasant and cooperative.  Head:  Normocephalic and atraumatic. Eyes:  Conjuctiva clear without scleral icterus. Mouth:  Oral mucosa pink and moist. Good dentition. No lesions. Heart:  S1, S2 present without murmurs, rubs, or gallops. Regular rate and rhythm. Abdomen:  +BS, soft, non-tender and non-distended. No rebound or guarding. No HSM or masses noted. Msk:  Symmetrical without gross deformities. Normal posture. Extremities:  Without edema. Neurologic:  Alert and  oriented x4;  grossly normal neurologically. Psych:  Alert and cooperative. Normal mood and affect.

## 2015-04-05 NOTE — Patient Instructions (Signed)
Stop Prevacid. Start taking Dexilant once daily in the morning. I have provided samples and a savings card. Let me know how this works. If it does, I will send in a prescription.  For constipation: start taking a probiotic daily such as Align, Restora, Digestive Advantage, Philip's Colon Health. I have provided samples. I have also provided samples of Amitiza, a gelcap to take twice a day WITH FOOD to avoid nausea. Let me know how this works for you.   We have scheduled an xray swallow of your esophagus.   Return in 3 months!

## 2015-04-09 NOTE — Assessment & Plan Note (Signed)
44 year old female with history of chronic GERD, last EGD in Feb 2015, now with mild solid food dysphagia and pill dysphagia, worsening GERD. Tried/failed multiple other PPIs. Likely symptoms related to worsening GERD. Query behavior and diet choices contributing to exacerbations. Trial Dexilant. Proceed with BPE. 3 months return.

## 2015-04-09 NOTE — Progress Notes (Signed)
CC'ED TO PCP 

## 2015-04-09 NOTE — Assessment & Plan Note (Signed)
Pain with Linzess. Trial probiotic and Amitiza 8 mcg samples. Return in 3 months.

## 2015-04-10 ENCOUNTER — Ambulatory Visit (HOSPITAL_COMMUNITY)
Admission: RE | Admit: 2015-04-10 | Discharge: 2015-04-10 | Disposition: A | Discharge status: Home / Self Care | Payer: BLUE CROSS/BLUE SHIELD | Source: Ambulatory Visit | Attending: Gastroenterology | Admitting: Gastroenterology

## 2015-04-10 DIAGNOSIS — R1314 Dysphagia, pharyngoesophageal phase: Secondary | ICD-10-CM | POA: Diagnosis not present

## 2015-04-16 NOTE — Progress Notes (Signed)
Quick Note:  Normal BPE. How is patient doing? ______

## 2015-04-25 NOTE — Progress Notes (Signed)
Quick Note:  I would like her referred to ENT. There is nothing on the BPE that is concerning. ______

## 2015-04-26 ENCOUNTER — Telehealth: Payer: Self-pay

## 2015-04-26 ENCOUNTER — Other Ambulatory Visit: Payer: Self-pay

## 2015-04-26 DIAGNOSIS — R1314 Dysphagia, pharyngoesophageal phase: Secondary | ICD-10-CM

## 2015-04-26 MED ORDER — DEXLANSOPRAZOLE 60 MG PO CPDR: 60.0000 mg | DELAYED_RELEASE_CAPSULE | Freq: Every day | ORAL | Status: DC

## 2015-04-26 NOTE — Telephone Encounter (Signed)
Completed.

## 2015-04-26 NOTE — Telephone Encounter (Signed)
Pt is requesting a prescription for dexilant sent to the pharmacy.

## 2015-06-13 ENCOUNTER — Ambulatory Visit (INDEPENDENT_AMBULATORY_CARE_PROVIDER_SITE_OTHER): Payer: BLUE CROSS/BLUE SHIELD | Admitting: Otolaryngology

## 2015-06-13 DIAGNOSIS — K219 Gastro-esophageal reflux disease without esophagitis: Secondary | ICD-10-CM | POA: Diagnosis not present

## 2015-06-13 DIAGNOSIS — R490 Dysphonia: Secondary | ICD-10-CM

## 2015-07-08 ENCOUNTER — Ambulatory Visit (INDEPENDENT_AMBULATORY_CARE_PROVIDER_SITE_OTHER): Payer: BLUE CROSS/BLUE SHIELD | Admitting: Gastroenterology

## 2015-07-08 ENCOUNTER — Encounter: Payer: Self-pay | Admitting: Gastroenterology

## 2015-07-08 VITALS — BP 108/73 | HR 71 | Temp 97.8°F | Ht 65.0 in | Wt 225.2 lb

## 2015-07-08 DIAGNOSIS — K219 Gastro-esophageal reflux disease without esophagitis: Secondary | ICD-10-CM

## 2015-07-08 DIAGNOSIS — K5900 Constipation, unspecified: Secondary | ICD-10-CM | POA: Diagnosis not present

## 2015-07-08 MED ORDER — LUBIPROSTONE 8 MCG PO CAPS: 8.0000 ug | ORAL_CAPSULE | Freq: Two times a day (BID) | ORAL | Status: DC

## 2015-07-08 MED ORDER — DEXLANSOPRAZOLE 60 MG PO CPDR: 60.0000 mg | DELAYED_RELEASE_CAPSULE | Freq: Every day | ORAL | Status: DC

## 2015-07-08 NOTE — Progress Notes (Signed)
Referring Provider: Glori Bickersrivedi, Rajendra, MD Primary Care Physician:  Glori BickersRIVEDI,RAJENDRA, MD  Chief Complaint  Patient presents with  . Follow-up    HPI:   Jeanette Cummings is a 44 y.o. female presenting today with a history of chronic GERD, last EGD in Feb 2015, with mild solid food dysphagia and pill dysphagia, worsening GERD. Tried/failed multiple other PPIs. Likely symptoms related to worsening GERD and BPE completed with normal esophageal distention and motility, no esophageal mass or stricture, normal. Dexilant prescribed at last visit. Here to follow-up for dysphagia and constipation. Referred to ENT.   Saw ENT and started on Ranitidine at night. [facility] and thinking she has other health issues (autoimmune) that may be contributing. Prescribed Plaquenil from [facility]. Throat doesn't feel any better. Doesn't feel like she has typical heartburn but throat feels the same. Still feels like there is something in her throat. Feels dry, sore. Feels hoarse. States she never got the prescription for Dexilant from her pharmacy. Unable to tolerate Linzess. Amitiza didn't make her sick. Didn't get refills of Amitiza either. Has been taking Prevacid instead and got really bad heartburn and had to take carafate.     Past Medical History  Diagnosis Date  . Hyperthyroidism   . Autoimmune disease (HCC)     large family history  . Frequent headaches   . History of cervical biopsy     multiple  . Multiple thyroid nodules   . Sjogren's disease (HCC)   . 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 Vasculitis [facility](HCC)     Past Surgical History  Procedure Laterality Date  . Thymectomy  03/09/05  . Foot surgery      both  . Wisdom tooth extraction    . Parathyroidectomy  2014  . History of thyroid radiation  2008  . Colonoscopy N/A 09/01/2013    Dr. Darrick PennaFields- normal mucosa in the terminal ileum. the colon is redundant, small internal hemorrhoids.  . Esophagogastroduodenoscopy N/A 09/01/2013     Dr.Fields- esophagus appeared normal, small hiatal hernia, moderate non-erosive gastritis found in gastric antrum. duodenum= no abnormalities bx= minimal chronic infammation.    Current Outpatient Prescriptions  Medication Sig Dispense Refill  . acetaminophen (TYLENOL) 500 MG tablet Take 1,000 mg by mouth every 6 (six) hours as needed.    . calcium-vitamin D (OSCAL WITH D) 250-125 MG-UNIT per tablet Take 1 tablet by mouth 2 (two) times daily.    . Cholecalciferol (VITAMIN D) 2000 UNITS tablet Take 2,000 Units by mouth daily.    . Cholecalciferol (VITAMIN D-1000 MAX ST) 1000 UNITS tablet Take by mouth.    . colchicine 0.6 MG tablet TAKE 1 TABLET ORALLY EVERY 12 HOURS WITH FOOD AS NEEDED PAIN IN FEET    . Cyanocobalamin (RA VITAMIN B-12 TR) 1000 MCG TBCR Take by mouth.    . cycloSPORINE (RESTASIS) 0.05 % ophthalmic emulsion Apply to eye.    . fluorometholone (FML) 0.1 % ophthalmic suspension 1 drop 2 (two) times daily.    Marland Kitchen. gabapentin (NEURONTIN) 300 MG capsule Take by mouth.    Marland Kitchen. ibuprofen (ADVIL,MOTRIN) 600 MG tablet Take 600 mg by mouth 2 (two) times daily as needed for moderate pain.     Marland Kitchen. lansoprazole (PREVACID) 30 MG capsule TAKE 1 CAPSULE (30 MG TOTAL) BY MOUTH DAILY BEFORE BREAKFAST. 30 capsule 5  . norethindrone-ethinyl estradiol-iron (JUNEL FE 1.5/30) 1.5-30 MG-MCG tablet Take 1 tablet by mouth daily.    . nortriptyline (PAMELOR)  10 MG capsule Take 1 capsule nightly for 1 week, then increase to 2 capsules nightly.    Bertram Gala Glycol-Propyl Glycol (SYSTANE OP) Place 1-2 drops into both eyes every 4 (four) hours as needed (dry eyes).    . ranitidine (ZANTAC) 150 MG tablet Take by mouth.    . Topiramate ER (TROKENDI XR) 200 MG CP24 Take by mouth.    . verapamil (VERELAN PM) 240 MG 24 hr capsule Take by mouth.    . dexlansoprazole (DEXILANT) 60 MG capsule Take 1 capsule (60 mg total) by mouth daily. (Patient not taking: Reported on 07/08/2015) 90 capsule 3  . topiramate (TOPAMAX)  100 MG tablet Take 125 mg by mouth daily.     No current facility-administered medications for this visit.    Allergies as of 07/08/2015  . (No Known Allergies)    Family History  Problem Relation Age of Onset  . Colon polyps Mother     numerous  . Colon cancer Maternal Aunt     diagnosed at age 73, deceased  . Colon cancer Cousin     diagnosed at age 64, deceased age 39  . Colon polyps Cousin     precancerous, age 14s  . Breast cancer Mother     age 68s  . Breast cancer Maternal Aunt     age 76, at age 44 new primry lung cancer deceased. Sjogrens', polymyositis, hyperthyroid, scleroderma  . Breast cancer Maternal Aunt     late 67s  . Ovarian cancer Paternal Grandmother   . Ovarian cancer Cousin     age 37, paternal  . Cancer Mother     died from metastatic brain cancer, not from breast cancer, unknown primary  . Lung cancer Maternal Grandmother     died age 46  . Other Son     IgA/IgE deficient  . Colon cancer Mother     Social History   Social History  . Marital Status: Married    Spouse Name: N/A  . Number of Children: 2  . Years of Education: N/A   Occupational History  .      Social History Main Topics  . Smoking status: Never Smoker   . Smokeless tobacco: None  . Alcohol Use: No  . Drug Use: No  . Sexual Activity: Not Asked   Other Topics Concern  . None   Social History Narrative    Review of Systems: As mentioned in HPI   Physical Exam: BP 108/73 mmHg  Pulse 71  Temp(Src) 97.8 F (36.6 C) (Oral)  Ht  (1.651 m)  Wt 225 lb 3.2 oz (102.15 kg)  BMI 37.48 kg/m2  LMP 07/01/2015 General:   Alert and oriented. No distress noted. Pleasant and cooperative.  Head:  Normocephalic and atraumatic. Eyes:  Conjuctiva clear without scleral icterus. Mouth:  Oral mucosa pink and moist. Good dentition. No lesions. Abdomen:  +BS, soft, non-tender and non-distended. Obese. No rebound or guarding. No HSM or masses noted. Extremities:  Without  edema. Neurologic:  Alert and  oriented x4;  grossly normal neurologically. Psych:  Alert and cooperative. Normal mood and affect.

## 2015-07-08 NOTE — Patient Instructions (Signed)
I have sent more Dexilant and Amitiza to your pharmacy.   Call me or send me a message through "[email or web address]" regarding the Dexilant cost.   I will see you in 3-4 months!

## 2015-07-22 NOTE — Assessment & Plan Note (Signed)
Ran out of Amitiza samples. Will send 8 mcg to pharmacy. Return in 3-4 months.

## 2015-07-22 NOTE — Assessment & Plan Note (Addendum)
44 year old female with complaints of hoarseness, dry throat, globus sensation. EGD fairly recently as of last year and BPE normal on file. Recently saw ENT and started on Ranitidine in evenings. Will trial Dexilant. Likely symptoms multifactorial. Needs to continue dietary and behavior modification. Return in 3-4 months.

## 2015-07-23 NOTE — Progress Notes (Signed)
CC'ED TO PCP 

## 2015-07-26 ENCOUNTER — Telehealth: Payer: Self-pay

## 2015-07-26 MED ORDER — ESOMEPRAZOLE MAGNESIUM 40 MG PO CPDR: 40.0000 mg | DELAYED_RELEASE_CAPSULE | Freq: Two times a day (BID) | ORAL | Status: DC

## 2015-07-26 NOTE — Telephone Encounter (Signed)
Did we give her a copay card?   I am sending in Nexium. This may or may not be covered. She has trialed Protonix and Prilosec in the past.

## 2015-07-26 NOTE — Telephone Encounter (Signed)
Yes but her co-pay will still be $80.00

## 2015-07-26 NOTE — Telephone Encounter (Signed)
Does she have insurance? If she doesn't, we can submit for patient assistance.

## 2015-07-26 NOTE — Telephone Encounter (Signed)
Can not afford the Dexliant and would like to have something different sent in.

## 2015-07-30 NOTE — Telephone Encounter (Signed)
She does have the co-pay card but it did not help with her co-pay.Pt is aware of nexium

## 2015-09-05 ENCOUNTER — Ambulatory Visit (INDEPENDENT_AMBULATORY_CARE_PROVIDER_SITE_OTHER): Payer: BLUE CROSS/BLUE SHIELD | Admitting: Otolaryngology

## 2015-09-05 DIAGNOSIS — K219 Gastro-esophageal reflux disease without esophagitis: Secondary | ICD-10-CM | POA: Diagnosis not present

## 2015-09-05 DIAGNOSIS — R1312 Dysphagia, oropharyngeal phase: Secondary | ICD-10-CM

## 2015-10-15 ENCOUNTER — Encounter: Payer: Self-pay | Admitting: Gastroenterology

## 2015-10-15 ENCOUNTER — Ambulatory Visit (INDEPENDENT_AMBULATORY_CARE_PROVIDER_SITE_OTHER): Payer: BLUE CROSS/BLUE SHIELD | Admitting: Gastroenterology

## 2015-10-15 VITALS — BP 107/71 | HR 66 | Temp 98.3°F | Ht 65.0 in | Wt 219.6 lb

## 2015-10-15 DIAGNOSIS — K219 Gastro-esophageal reflux disease without esophagitis: Secondary | ICD-10-CM | POA: Diagnosis not present

## 2015-10-15 MED ORDER — OMEPRAZOLE 20 MG PO CPDR: 20.0000 mg | DELAYED_RELEASE_CAPSULE | Freq: Two times a day (BID) | ORAL | Status: DC

## 2015-10-15 NOTE — Assessment & Plan Note (Signed)
45 year old female with worsening symptoms, globus sensation, hoarseness, and now with epigastric discomfort. EGD last done about 2 years ago. Has been seen by ENT per patient. Attempting dietary and behavior modification but with worsening symptoms. I suspect a component of LPR. Will increase Prilosec to BID. She has had difficulty getting PPI coverage that is affordable. I have provided samples today. May need pH/manometry. Will proceed with EGD and possible dilation as her symptoms continue to worsen to exclude any underlying issue.   Proceed with upper endoscopy +/- dilation in the near future with Dr. Darrick PennaFields. The risks, benefits, and alternatives have been discussed in detail with patient. They have stated understanding and desire to proceed.  PROPOFOL due to polypharmacy

## 2015-10-15 NOTE — Patient Instructions (Signed)
I have sent in Prilosec to take twice a day, 30 minutes before breakfast and dinner. I have provided samples. Let me know if this is too pricey at the pharmacy.  We have scheduled you for an upper endoscopy and possible dilation with Dr. Darrick PennaFields in the near future.

## 2015-10-15 NOTE — Progress Notes (Addendum)
REVIEWED-NO ADDITIONAL RECOMMENDATIONS.  Referring Provider: Glori Bickers, MD Primary Care Physician:  Glori Bickers, MD  Primary GI: Dr. Darrick Penna   Chief Complaint  Patient presents with  . Follow-up    HPI:   Jeanette Cummings is a 45 y.o. female presenting today with a history of chronic GERD, last EGD in Feb 2015, with mild solid food dysphagia and pill dysphagia, worsening GERD. Tried/failed multiple other PPIs. Likely symptoms related to worsening GERD and BPE completed with normal esophageal distention and motility, no esophageal mass or stricture, normal. Has seen ENT and prescribed ranitidine in evenings. Has been seen at [facility] due to history of autoimmune issues. Dexilant was too expensive.  Prescribed Nexium. Was told her insurance would only pay 50% of it.  Amitiza for constipation.   Saw ENT again and had was told still had severe to moderate laryngeal edema. Still feels hoarse. Has pain in epigastric area. Feels like she has a pit in her stomach. Feels discomfort in chest. Has had several episodes of feeling short of breath, difficulty breathing. Hasn't had this before. Still with solid food and pill dysphagia. No Aleve. Rarely takes Motrin. No N/V. Taking OTC Prilosec now. Chewing Tums. Symptoms worsening and miserable.   Past Medical History  Diagnosis Date  . Hyperthyroidism   . Autoimmune disease (HCC)     large family history  . Frequent headaches   . History of cervical biopsy     multiple  . Multiple thyroid nodules   . Sjogren's disease (HCC)   . Stroke [facility])     diagnosed with vasculitis  . Vasculitis [facility])     Past Surgical History  Procedure Laterality Date  . Thymectomy  03/09/05  . Foot surgery      both  . Wisdom tooth extraction    . Parathyroidectomy  2014  . History of thyroid radiation  2008  . Colonoscopy N/A 09/01/2013    Dr. Darrick Penna- normal mucosa in the terminal ileum. the colon is redundant, small internal hemorrhoids.  .  Esophagogastroduodenoscopy N/A 09/01/2013    Dr.Fields- esophagus appeared normal, small hiatal hernia, moderate non-erosive gastritis found in gastric antrum. duodenum= no abnormalities bx= minimal chronic infammation.    Current Outpatient Prescriptions  Medication Sig Dispense Refill  . acetaminophen (TYLENOL) 500 MG tablet Take 1,000 mg by mouth every 6 (six) hours as needed.    . calcium-vitamin D (OSCAL WITH D) 250-125 MG-UNIT per tablet Take 1 tablet by mouth 2 (two) times daily.    . Cholecalciferol (VITAMIN D) 2000 UNITS tablet Take 2,000 Units by mouth daily.    . Cholecalciferol (VITAMIN D-1000 MAX ST) 1000 UNITS tablet Take by mouth.    . colchicine 0.6 MG tablet TAKE 1 TABLET ORALLY EVERY 12 HOURS WITH FOOD AS NEEDED PAIN IN FEET    . Cyanocobalamin (RA VITAMIN B-12 TR) 1000 MCG TBCR Take by mouth.    . cycloSPORINE (RESTASIS) 0.05 % ophthalmic emulsion Apply to eye.    . esomeprazole (NEXIUM) 40 MG capsule Take 1 capsule (40 mg total) by mouth 2 (two) times daily before a meal. 60 capsule 3  . fluorometholone (FML) 0.1 % ophthalmic suspension 1 drop 2 (two) times daily.    Marland Kitchen gabapentin (NEURONTIN) 300 MG capsule Take by mouth.    Marland Kitchen ibuprofen (ADVIL,MOTRIN) 600 MG tablet Take 600 mg by mouth 2 (two) times daily as needed for moderate pain.     Marland Kitchen lubiprostone (AMITIZA) 8 MCG capsule Take 1 capsule (8 mcg total) by  mouth 2 (two) times daily with a meal. 60 capsule 3  . norethindrone-ethinyl estradiol-iron (JUNEL FE 1.5/30) 1.5-30 MG-MCG tablet Take 1 tablet by mouth daily.    . nortriptyline (PAMELOR) 10 MG capsule Take 1 capsule nightly for 1 week, then increase to 2 capsules nightly.    Bertram Gala. Polyethyl Glycol-Propyl Glycol (SYSTANE OP) Place 1-2 drops into both eyes every 4 (four) hours as needed (dry eyes).    . ranitidine (ZANTAC) 150 MG tablet Take by mouth.    . Topiramate ER (TROKENDI XR) 200 MG CP24 Take by mouth.    . verapamil (VERELAN PM) 240 MG 24 hr capsule Take by mouth.       No current facility-administered medications for this visit.    Allergies as of 10/15/2015  . (No Known Allergies)    Family History  Problem Relation Age of Onset  . Colon polyps Mother     numerous  . Colon cancer Maternal Aunt     diagnosed at age 45, deceased  . Colon cancer Cousin     diagnosed at age 45, deceased age 45  . Colon polyps Cousin     precancerous, age 3130s  . Breast cancer Mother     age 3030s  . Breast cancer Maternal Aunt     age 45, at age 45 new primry lung cancer deceased. Sjogrens', polymyositis, hyperthyroid, scleroderma  . Breast cancer Maternal Aunt     late 7060s  . Ovarian cancer Paternal Grandmother   . Ovarian cancer Cousin     age 442, paternal  . Cancer Mother     died from metastatic brain cancer, not from breast cancer, unknown primary  . Lung cancer Maternal Grandmother     died age 45  . Other Son     IgA/IgE deficient  . Colon cancer Mother     Social History   Social History  . Marital Status: Married    Spouse Name: N/A  . Number of Children: 2  . Years of Education: N/A   Occupational History  .      Social History Main Topics  . Smoking status: Never Smoker   . Smokeless tobacco: None  . Alcohol Use: No  . Drug Use: No  . Sexual Activity: Not Asked   Other Topics Concern  . None   Social History Narrative    Review of Systems: Negative unless mentioned in HPI   Physical Exam: BP 107/71 mmHg  Pulse 66  Temp(Src) 98.3 F (36.8 C) (Oral)  Ht 5\' 5"  (1.651 m)  Wt 219 lb 9.6 oz (99.61 kg)  BMI 36.54 kg/m2 General:   Alert and oriented. No distress noted. Pleasant and cooperative.  Head:  Normocephalic and atraumatic. Eyes:  Conjuctiva clear without scleral icterus. Mouth:  Oral mucosa pink and moist. Good dentition. No lesions. Heart:  S1, S2 present without murmurs, rubs, or gallops. Regular rate and rhythm. Abdomen:  +BS, soft, TTP epigastric region and non-distended. No rebound or guarding. No HSM or  masses noted. Msk:  Symmetrical without gross deformities. Normal posture. Extremities:  Without edema. Neurologic:  Alert and  oriented x4;  grossly normal neurologically. Psych:  Alert and cooperative. Normal mood and affect.

## 2015-10-16 NOTE — Progress Notes (Signed)
CC'D TO PCP °

## 2015-11-11 ENCOUNTER — Encounter (HOSPITAL_COMMUNITY): Payer: Self-pay

## 2015-12-05 ENCOUNTER — Ambulatory Visit (INDEPENDENT_AMBULATORY_CARE_PROVIDER_SITE_OTHER): Payer: BLUE CROSS/BLUE SHIELD | Admitting: Otolaryngology

## 2015-12-05 DIAGNOSIS — K219 Gastro-esophageal reflux disease without esophagitis: Secondary | ICD-10-CM | POA: Diagnosis not present

## 2015-12-05 DIAGNOSIS — R490 Dysphonia: Secondary | ICD-10-CM

## 2015-12-12 ENCOUNTER — Ambulatory Visit (INDEPENDENT_AMBULATORY_CARE_PROVIDER_SITE_OTHER): Payer: BLUE CROSS/BLUE SHIELD | Admitting: Otolaryngology

## 2015-12-30 ENCOUNTER — Ambulatory Visit (INDEPENDENT_AMBULATORY_CARE_PROVIDER_SITE_OTHER): Payer: BLUE CROSS/BLUE SHIELD | Admitting: Otolaryngology

## 2015-12-30 DIAGNOSIS — K219 Gastro-esophageal reflux disease without esophagitis: Secondary | ICD-10-CM

## 2015-12-30 DIAGNOSIS — J382 Nodules of vocal cords: Secondary | ICD-10-CM | POA: Diagnosis not present

## 2015-12-30 DIAGNOSIS — R490 Dysphonia: Secondary | ICD-10-CM

## 2016-01-06 IMAGING — RF DG ESOPHAGUS
16 of 24 series · 16 of 24 positions shown · non-contrast
Comparison: None

CLINICAL DATA: Dysphagia in cervical region, feels like something
is stuck in throat all the time, feels like solids and pills get
stuck in throat area for couple months, history of reflux, hiatal
hernia, thymectomy for tumor, hyperthyroidism post radioactive
iodine ablation, parathyroidectomy, autoimmune disease, Sadikin
disease, neuropathy, lower extremity vasculitis, MEN I syndrome

EXAM:
ESOPHOGRAM / BARIUM SWALLOW / BARIUM TABLET STUDY
TECHNIQUE: Combined double contrast and single contrast examination performed
using effervescent crystals, thick barium liquid, and thin barium
liquid. The patient was observed with fluoroscopy swallowing a 13 mm
barium sulphate tablet.
FLUOROSCOPY TIME:  Radiation Exposure Index (as provided by the
fluoroscopic device): Not provided
If the device does not provide the exposure index:
Fluoroscopy Time:  1 minutes 42 seconds
Number of Acquired Images: 15 plus multiple screen captures during
fluoroscopy

[Series 1: run · 1 of 1 slices shown (1 of 16)]
[im 1/1]
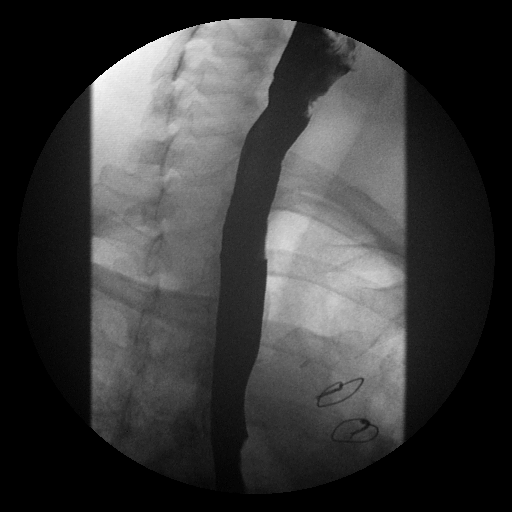

[Series 3: run · 1 of 1 slices shown (2 of 16)]
[im 1/1]
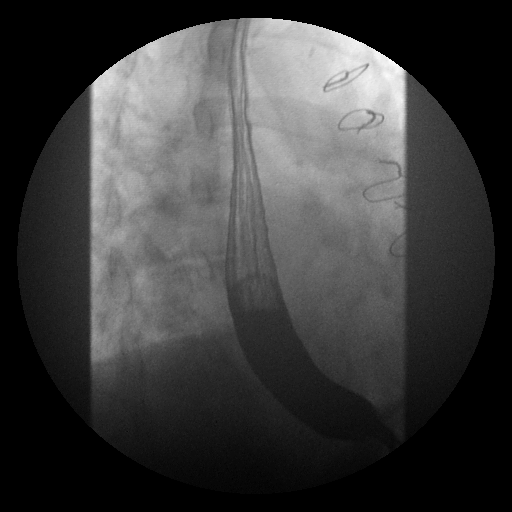

[Series 4: run · 1 of 1 slices shown (3 of 16)]
[im 1/1]
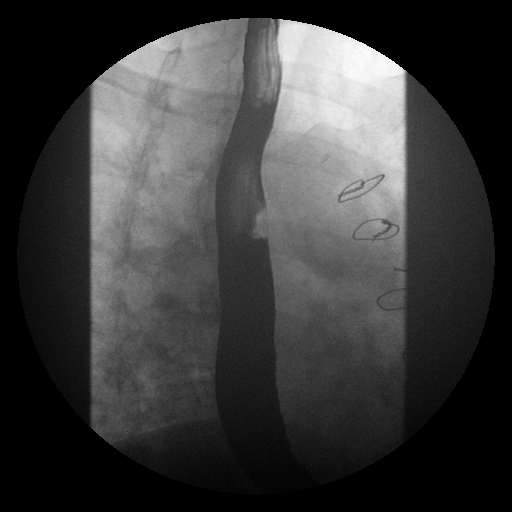

[Series 6: run · 1 of 1 slices shown (4 of 16)]
[im 1/1]
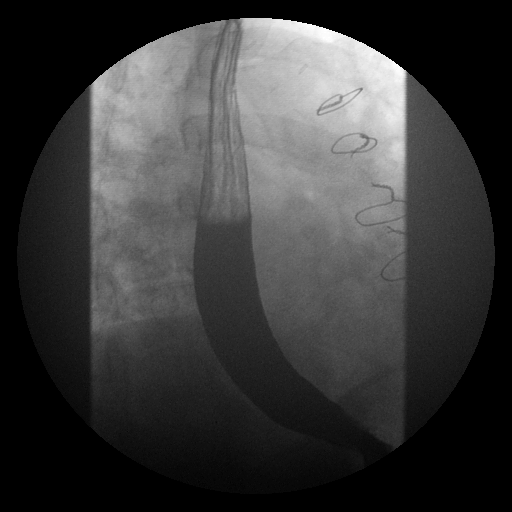

[Series 7: run · 1 of 1 slices shown (5 of 16)]
[im 1/1]
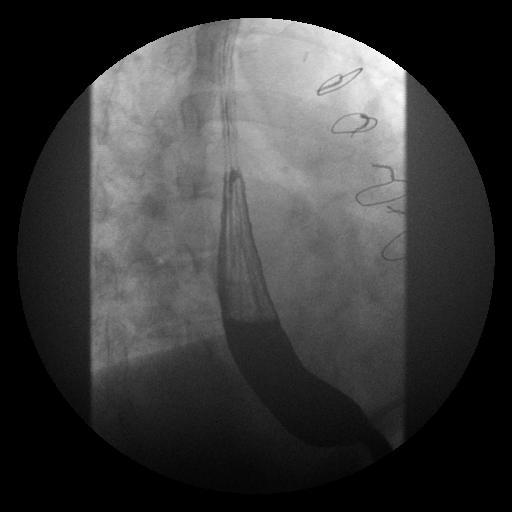

[Series 9: run · 1 of 1 slices shown (6 of 16)]
[im 1/1]
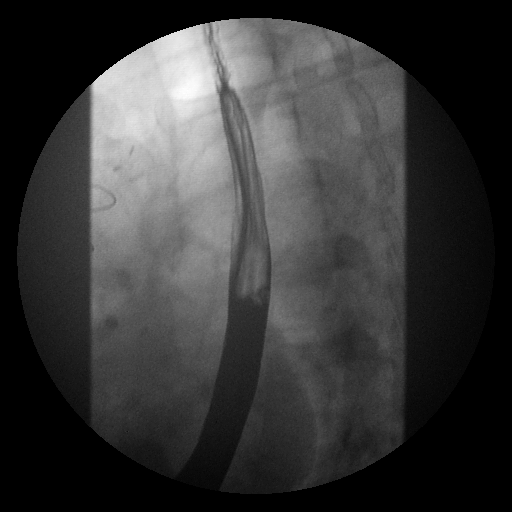

[Series 10: run · 1 of 1 slices shown (7 of 16)]
[im 1/1]
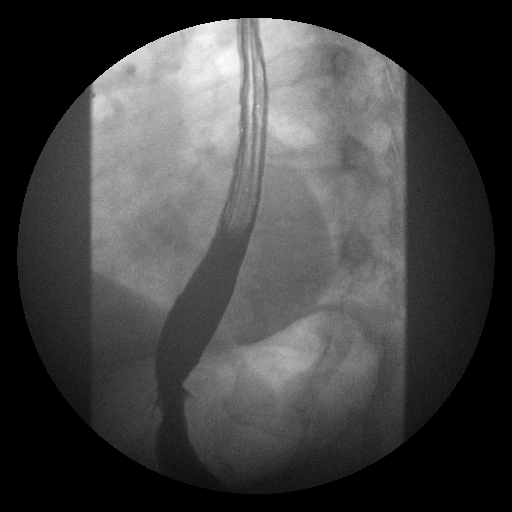

[Series 12: run · 1 of 1 slices shown (8 of 16)]
[im 1/1]
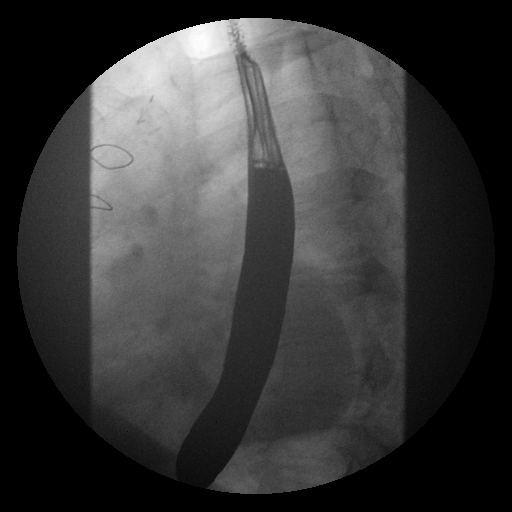

[Series 13: run · 1 of 1 slices shown (9 of 16)]
[im 1/1]
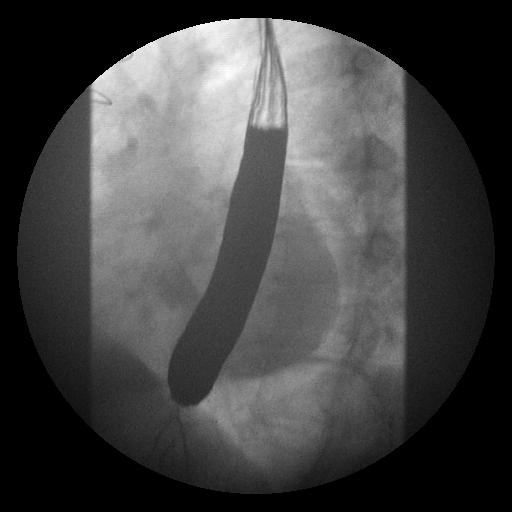

[Series 15: run · 1 of 7 slices shown (10 of 16)]
[im 1/7]
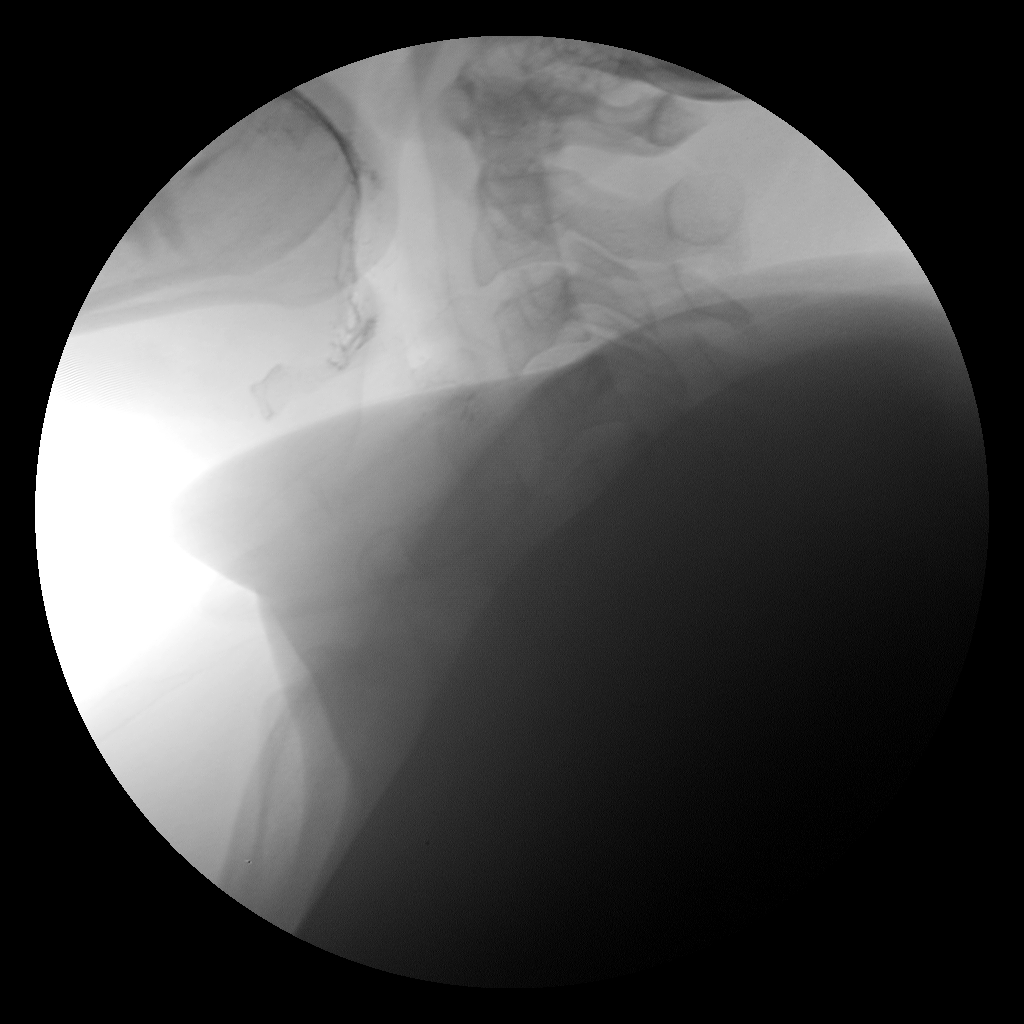

[Series 16: run · 1 of 1 slices shown (11 of 16)]
[im 1/1]
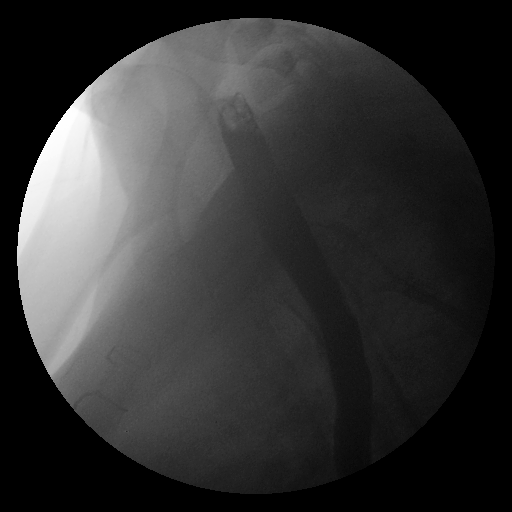

[Series 18: run · 1 of 1 slices shown (12 of 16)]
[im 1/1]
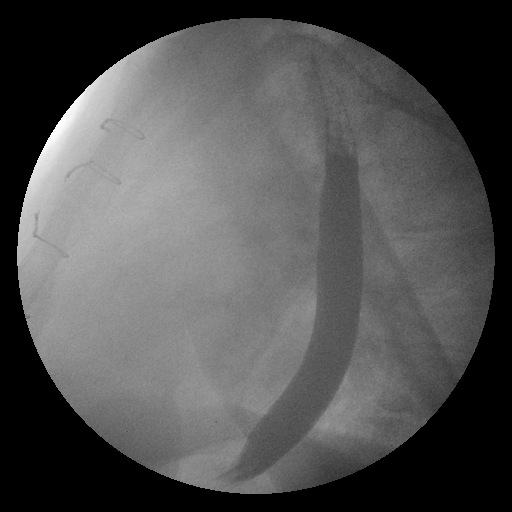

[Series 19: run · 1 of 1 slices shown (13 of 16)]
[im 1/1]
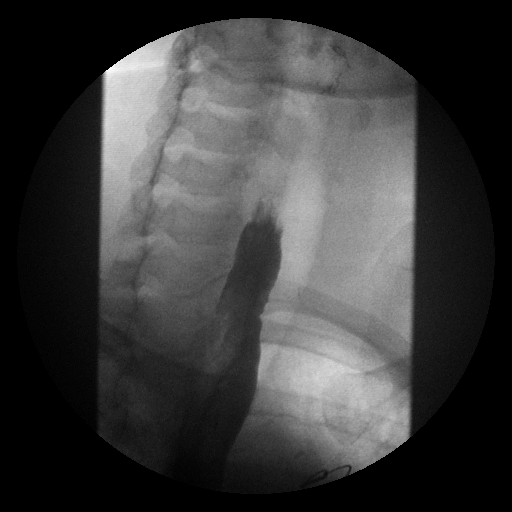

[Series 21: run · 1 of 1 slices shown (14 of 16)]
[im 1/1]
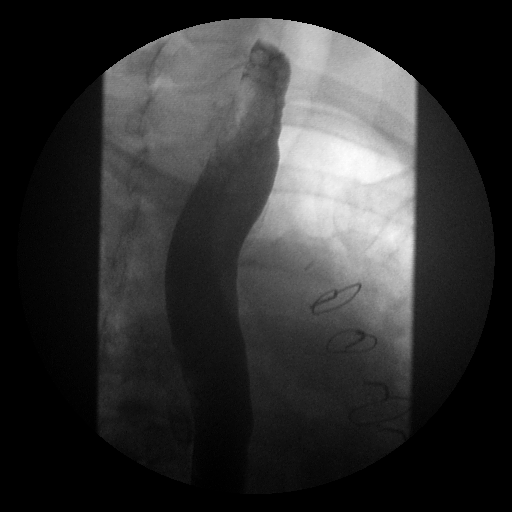

[Series 22: run · 1 of 1 slices shown (15 of 16)]
[im 1/1]
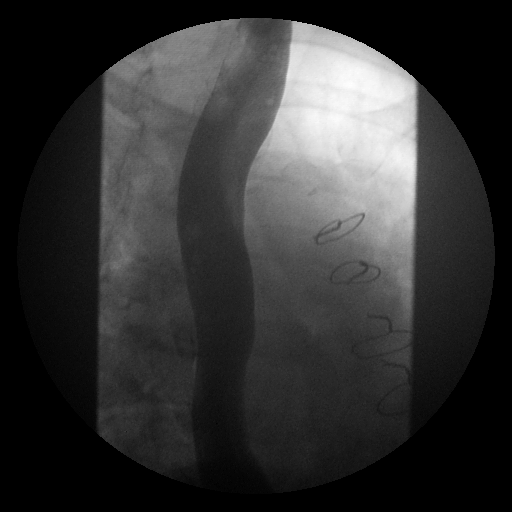

[Series 24: run · 1 of 1 slices shown (16 of 16)]
[im 1/1]
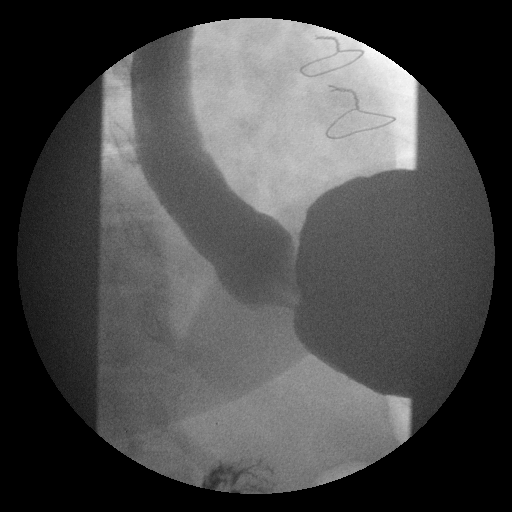

[16 of 24 positions shown; findings below may reference images not displayed]

FINDINGS: Normal esophageal distention and motility.

No esophageal mass or stricture.

12.5 mm diameter barium tablet passed from oral cavity to stomach
without delay.

Smooth appearance of esophageal mucosa without irregularity or
ulceration.

No persistent intraluminal filling defects or definite hiatal hernia
identified.

Targeted rapid sequence imaging of the cervical esophagus and
hypopharynx showed no laryngeal penetration or aspiration.

Sternotomy wires noted from prior thymectomy.
IMPRESSION: Normal esophagram.

## 2016-03-26 ENCOUNTER — Ambulatory Visit (INDEPENDENT_AMBULATORY_CARE_PROVIDER_SITE_OTHER): Payer: BLUE CROSS/BLUE SHIELD | Admitting: Otolaryngology

## 2016-03-26 DIAGNOSIS — K219 Gastro-esophageal reflux disease without esophagitis: Secondary | ICD-10-CM

## 2016-03-26 DIAGNOSIS — R070 Pain in throat: Secondary | ICD-10-CM | POA: Diagnosis not present

## 2016-03-26 DIAGNOSIS — R490 Dysphonia: Secondary | ICD-10-CM

## 2016-05-19 DIAGNOSIS — E041 Nontoxic single thyroid nodule: Secondary | ICD-10-CM | POA: Diagnosis not present

## 2016-05-19 DIAGNOSIS — Z8639 Personal history of other endocrine, nutritional and metabolic disease: Secondary | ICD-10-CM | POA: Diagnosis not present

## 2016-05-21 DIAGNOSIS — E210 Primary hyperparathyroidism: Secondary | ICD-10-CM | POA: Diagnosis not present

## 2016-05-21 DIAGNOSIS — E892 Postprocedural hypoparathyroidism: Secondary | ICD-10-CM | POA: Diagnosis not present

## 2016-05-21 DIAGNOSIS — E041 Nontoxic single thyroid nodule: Secondary | ICD-10-CM | POA: Diagnosis not present

## 2016-05-21 DIAGNOSIS — Z8639 Personal history of other endocrine, nutritional and metabolic disease: Secondary | ICD-10-CM | POA: Diagnosis not present

## 2016-05-21 DIAGNOSIS — Z23 Encounter for immunization: Secondary | ICD-10-CM | POA: Diagnosis not present

## 2016-05-21 DIAGNOSIS — Z9889 Other specified postprocedural states: Secondary | ICD-10-CM | POA: Diagnosis not present

## 2016-05-28 ENCOUNTER — Ambulatory Visit (INDEPENDENT_AMBULATORY_CARE_PROVIDER_SITE_OTHER): Payer: BLUE CROSS/BLUE SHIELD | Admitting: Otolaryngology

## 2016-06-11 ENCOUNTER — Ambulatory Visit (INDEPENDENT_AMBULATORY_CARE_PROVIDER_SITE_OTHER): Payer: BLUE CROSS/BLUE SHIELD | Admitting: Otolaryngology

## 2016-06-11 DIAGNOSIS — K219 Gastro-esophageal reflux disease without esophagitis: Secondary | ICD-10-CM

## 2016-06-11 DIAGNOSIS — R490 Dysphonia: Secondary | ICD-10-CM | POA: Diagnosis not present

## 2016-06-29 DIAGNOSIS — M3500 Sicca syndrome, unspecified: Secondary | ICD-10-CM | POA: Diagnosis not present

## 2016-06-29 DIAGNOSIS — F09 Unspecified mental disorder due to known physiological condition: Secondary | ICD-10-CM | POA: Diagnosis not present

## 2016-06-29 DIAGNOSIS — G629 Polyneuropathy, unspecified: Secondary | ICD-10-CM | POA: Diagnosis not present

## 2016-07-18 ENCOUNTER — Other Ambulatory Visit: Payer: Self-pay | Admitting: Gastroenterology

## 2016-07-22 ENCOUNTER — Encounter: Payer: Self-pay | Admitting: Gastroenterology

## 2016-07-22 NOTE — Telephone Encounter (Signed)
Refill X 3. Needs ov in 09/2016.

## 2016-07-27 HISTORY — PX: ESOPHAGEAL MANOMETRY: SHX1526

## 2016-09-28 DIAGNOSIS — M3500 Sicca syndrome, unspecified: Secondary | ICD-10-CM | POA: Diagnosis not present

## 2016-09-28 DIAGNOSIS — G629 Polyneuropathy, unspecified: Secondary | ICD-10-CM | POA: Diagnosis not present

## 2016-10-06 ENCOUNTER — Encounter: Payer: Self-pay | Admitting: Gastroenterology

## 2016-10-06 ENCOUNTER — Ambulatory Visit (INDEPENDENT_AMBULATORY_CARE_PROVIDER_SITE_OTHER): Payer: BLUE CROSS/BLUE SHIELD | Admitting: Gastroenterology

## 2016-10-06 ENCOUNTER — Telehealth: Payer: Self-pay

## 2016-10-06 ENCOUNTER — Other Ambulatory Visit: Payer: Self-pay

## 2016-10-06 VITALS — BP 115/70 | HR 97 | Temp 98.3°F | Ht 65.0 in | Wt 208.2 lb

## 2016-10-06 DIAGNOSIS — R1314 Dysphagia, pharyngoesophageal phase: Secondary | ICD-10-CM

## 2016-10-06 DIAGNOSIS — K625 Hemorrhage of anus and rectum: Secondary | ICD-10-CM | POA: Diagnosis not present

## 2016-10-06 DIAGNOSIS — R1013 Epigastric pain: Secondary | ICD-10-CM

## 2016-10-06 DIAGNOSIS — R1310 Dysphagia, unspecified: Secondary | ICD-10-CM

## 2016-10-06 NOTE — Telephone Encounter (Signed)
Called pt to inform of pre-op appt 10/21/16 at 11:00am. She is unable to do pre-op that day. Call was disconnected. Called pt back and LMOVM and gave her phone number to Endo scheduler so she can reschedule pre-op.

## 2016-10-06 NOTE — Progress Notes (Addendum)
REVIEWED-NO ADDITIONAL RECOMMENDATIONS.  Referring Provider: Glori Bickers, MD Primary Care Physician:  Glori Bickers, MD Primary GI: Dr. Darrick Penna   Chief Complaint  Patient presents with  . Abdominal Pain    mid, lower abd    HPI:   Jeanette Cummings is a 46 y.o. female presenting today with a history of chronic GERD, last EGD in Feb 2015, with mild solid food dysphagia and pill dysphagia, worsening GERD. Tried/failed multiple other PPIs. Likely symptoms related to worsening GERD and BPE completed with normal esophageal distention and motility, no esophageal mass or stricture, normal. Has seen ENT and prescribed ranitidine in evenings, told she had moderate laryngeal edema. Has been seen at [facility] due to history of autoimmune issues.   Last seen a year ago and recommended EGD with dilation. Consideration for pH/manometry raised. Diagnosed with Sjogren's. Still issues with dysphagia, specifically with breads, meat, sometimes pills. Sees ENT and believes she has an appt Thursday (Dr. Suszanne Conners). Was told "she is as good as she is going to get". Was told she doesn't have as bad vocal cord edema. Heartburn not as bad as it used to be. Still feels globus sensation. Omeprazole in the morning and 150 Zantac in evening.   Notes upper abdominal/epigastric pain that is frequent. Present at least a year. Still with constipation. Linzess was too harsh. Amitiza 8 mcg  caused a lot of bloating/pain. Will have spans of constipation, sometimes diarrhea. Stomach feels unpredictable. Really feels like symptoms are mixed 50/50. Gets bloated with fiber. Has taken probiotics before but not sure if it helped. Taking Ibuprofen occasionally. Still with intermittent rectal bleeding if diarrhea. Does best with Senna or OTC agents. Last colonoscopy 2015 with normal mucosa in TI, small internal hemorrhoids.   Mother had multiple colon polyps and unsure if she ever had colon cancer. Died of metastatic disease but could never  determine primary. Died at age 34. Maternal aunt with colorectal cancer, cousin with colon cancer.    Past Medical History:  Diagnosis Date  . 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 Frequent headaches   . History of cervical biopsy    multiple  . Hyperthyroidism   . Multiple thyroid nodules   . Sjogren's disease (HCC)   . 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 Vasculitis [facility])     Past Surgical History:  Procedure Laterality Date  . COLONOSCOPY N/A 09/01/2013   Dr. Darrick Penna- normal mucosa in the terminal ileum. the colon is redundant, small internal hemorrhoids.  . ESOPHAGOGASTRODUODENOSCOPY N/A 09/01/2013   Dr.Fields- esophagus appeared normal, small hiatal hernia, moderate non-erosive gastritis found in gastric antrum. duodenum= no abnormalities bx= minimal chronic infammation.  Marland Kitchen FOOT SURGERY     both  . history of thyroid radiation  2008  . parathyroidectomy  2014  . THYMECTOMY  03/09/05  . WISDOM TOOTH EXTRACTION      Current Outpatient Prescriptions  Medication Sig Dispense Refill  . acetaminophen (TYLENOL) 500 MG tablet Take 1,000 mg by mouth every 6 (six) hours as needed.    Allen Kell Lipoic Acid 200 MG CAPS Take 200 mg by mouth daily.    . Ascorbic Acid (VITAMIN C) 1000 MG tablet Take 1,000 mg by mouth daily.    . calcium-vitamin D (OSCAL WITH D) 250-125 MG-UNIT per tablet Take 1 tablet by mouth 2 (two) times daily.    . Cholecalciferol (VITAMIN D) 2000 UNITS tablet Take 5,000 Units by mouth daily.     Marland Kitchen  Cyanocobalamin (RA VITAMIN B-12 TR) 1000 MCG TBCR Take by mouth daily. 1/2 tablet daily    . cycloSPORINE (RESTASIS) 0.05 % ophthalmic emulsion Apply to eye.    . fluorometholone (FML) 0.1 % ophthalmic suspension 1 drop 2 (two) times daily.    . Gabapentin, Once-Daily, (GRALISE) 600 MG TABS Take 2,400 mg by mouth daily.    . hydroxychloroquine (PLAQUENIL) 200 MG tablet Take 400 mg by mouth daily.  0  . ibuprofen (ADVIL,MOTRIN) 600 MG tablet Take 600 mg  by mouth 2 (two) times daily as needed for moderate pain.     Marland Kitchen. lubiprostone (AMITIZA) 8 MCG capsule Take 1 capsule (8 mcg total) by mouth 2 (two) times daily with a meal. (Patient taking differently: Take 8 mcg by mouth daily. Sometimes takes every other day) 60 capsule 3  . MedroxyPROGESTERone Acetate 150 MG/ML SUSY Inject 150 mg into the muscle every 3 (three) months.  3  . omeprazole (PRILOSEC) 20 MG capsule TAKE 1 CAPSULE (20 MG TOTAL) BY MOUTH 2 (TWO) TIMES DAILY BEFORE A MEAL. (Patient taking differently: Take 20 mg by mouth daily. ) 60 capsule 2  . pentoxifylline (TRENTAL) 400 MG CR tablet Take 400 mg by mouth daily.    Bertram Gala. Polyethyl Glycol-Propyl Glycol (SYSTANE OP) Place 1-2 drops into both eyes every 4 (four) hours as needed (dry eyes).    . ranitidine (ZANTAC) 150 MG tablet Take 150 mg by mouth daily.     . Topiramate ER (TROKENDI XR) 200 MG CP24 Take 400 mg by mouth daily.     . TRULICITY 0.75 MG/0.5ML SOPN INJECT 1 PEN SUBCUTANEOULY ONCE A WEEK  2  . verapamil (VERELAN PM) 240 MG 24 hr capsule Take 240 mg by mouth daily.     . polyethylene glycol-electrolytes (TRILYTE) 420 g solution Take 4,000 mLs by mouth as directed. 4000 mL 0   No current facility-administered medications for this visit.     Allergies as of 10/06/2016 - Review Complete 10/06/2016  Allergen Reaction Noted  . Sumatriptan Other (See Comments) 01/11/2014    Family History  Problem Relation Age of Onset  . Colon polyps Mother     numerous  . Breast cancer Mother     age 5030s  . Cancer Mother     died from metastatic brain cancer, not from breast cancer, unknown primary  . Colon cancer Maternal Aunt     diagnosed at age 46, deceased  . Colon cancer Cousin     diagnosed at age 46, deceased age 46  . Colon polyps Cousin     precancerous, age 6130s  . Breast cancer Maternal Aunt     age 46, at age 46 new primry lung cancer deceased. Sjogrens', polymyositis, hyperthyroid, scleroderma  . Breast cancer Maternal  Aunt     late 6060s  . Ovarian cancer Paternal Grandmother   . Ovarian cancer Cousin     age 46, paternal  . Lung cancer Maternal Grandmother     died age 46  . Other Son     IgA/IgE deficient    Social History   Social History  . Marital status: Married    Spouse name: N/A  . Number of children: 2  . Years of education: N/A   Occupational History  .      Social History Main Topics  . Smoking status: Never Smoker  . Smokeless tobacco: Never Used  . Alcohol use No  . Drug use: No  . Sexual activity: Not Asked  Other Topics Concern  . None   Social History Narrative  . None    Review of Systems: As mentioned in HPI   Physical Exam: BP 115/70   Pulse 97   Temp 98.3 F (36.8 C) (Oral)   Ht 5\' 5"  (1.651 m)   Wt 208 lb 3.2 oz (94.4 kg)   LMP 09/08/2016 (Approximate)   BMI 34.65 kg/m  General:   Alert and oriented. No distress noted. Pleasant and cooperative.  Head:  Normocephalic and atraumatic. Eyes:  Conjuctiva clear without scleral icterus. Mouth:  Oral mucosa pink and moist. Good dentition. No lesions. Heart:  S1, S2 present without murmurs, rubs, or gallops. Regular rate and rhythm. Abdomen:  +BS, soft, mild TTP epigastric and non-distended. No rebound or guarding. No HSM or masses noted. Msk:  Symmetrical without gross deformities. Normal posture. Extremities:  Without edema. Neurologic:  Alert and  oriented x4;  grossly normal neurologically. Psych:  Alert and cooperative. Normal mood and affect.

## 2016-10-06 NOTE — Patient Instructions (Signed)
We have scheduled you for a flexible sigmoidoscopy with banding at the time of the upper endoscopy with dilation by Dr. Darrick PennaFields.   I will get the last notes from Dr. Suszanne Connerseoh.   I recommend for now taking a probiotic daily such as Align, Restora, Digestive Advantage, [employer]. Try to minimize ibuprofen use. For constipation, you may take senna or docusate sodium. Let's hold off on Amitiza for now as that is not helping.

## 2016-10-06 NOTE — Patient Instructions (Signed)
Called [employer] for PA for Flex Sig/Hem banding/EGD/DIL. No PA needed, ref# [id number].

## 2016-10-07 ENCOUNTER — Encounter: Payer: Self-pay | Admitting: Gastroenterology

## 2016-10-07 ENCOUNTER — Telehealth: Payer: Self-pay | Admitting: Gastroenterology

## 2016-10-07 NOTE — Telephone Encounter (Signed)
Contacted patient to verify if mother ever had colon cancer. Patient is unsure but does know she had numerous colonic polyps; she ended up passing away at age 46 with metastatic disease of unknown origin. I feel she needs a complete colonoscopy, not just flex sig, but STILL WITH HEMORRHOID BANDING AS APPROPRIATE.   Can we keep the same slot she has and make it a complete colonoscopy? She is aware of this new recommendation.

## 2016-10-08 ENCOUNTER — Ambulatory Visit (INDEPENDENT_AMBULATORY_CARE_PROVIDER_SITE_OTHER): Payer: BLUE CROSS/BLUE SHIELD | Admitting: Otolaryngology

## 2016-10-08 ENCOUNTER — Other Ambulatory Visit: Payer: Self-pay

## 2016-10-08 DIAGNOSIS — K219 Gastro-esophageal reflux disease without esophagitis: Secondary | ICD-10-CM

## 2016-10-08 DIAGNOSIS — K625 Hemorrhage of anus and rectum: Secondary | ICD-10-CM

## 2016-10-08 DIAGNOSIS — R070 Pain in throat: Secondary | ICD-10-CM | POA: Diagnosis not present

## 2016-10-08 DIAGNOSIS — R1310 Dysphagia, unspecified: Secondary | ICD-10-CM

## 2016-10-08 DIAGNOSIS — R1013 Epigastric pain: Secondary | ICD-10-CM

## 2016-10-08 MED ORDER — PEG 3350-KCL-NA BICARB-NACL 420 G PO SOLR: 4000.0000 mL | ORAL | 0 refills | Status: DC

## 2016-10-08 NOTE — Telephone Encounter (Signed)
Called [employer] for PA for TCS. No PA needed. Ref# [id number].

## 2016-10-08 NOTE — Telephone Encounter (Signed)
Called pt. TCS/possible hemorrhoid banding/EGD/DIL w/Propofol with Dr. Darrick PennaFields is 10/27/16 at 12:45pm. Called endo scheduler and informed to cancel previous procedure orders. New orders entered. Pre-op 10/21/16 at 11:00am. Rx for prep sent to pharmacy. New instructions mailed to pt.   AB advised possible hemorrhoid banding and TCS is for rectal bleeding.

## 2016-10-09 ENCOUNTER — Telehealth: Payer: Self-pay | Admitting: Gastroenterology

## 2016-10-09 ENCOUNTER — Encounter: Payer: Self-pay | Admitting: Gastroenterology

## 2016-10-09 NOTE — Progress Notes (Signed)
CC'ED TO PCP 

## 2016-10-09 NOTE — Telephone Encounter (Signed)
Please make an appt for 3 months with me for follow-up of GERD, rectal bleeding. I forgot to make that when I saw her.

## 2016-10-09 NOTE — Assessment & Plan Note (Signed)
46 year old female with history of chronic GERD, last EGD in Feb 2015, now with persistent dysphagia, globus sensation, frequent epigastric pain. Previously has had a BPE with normal esophageal distension and motility (Sept 2016) and is followed by ENT due to likely LPR. May ultimately need pH/manometry, but I offered an EGD as it has been 3 years since her upper GI tract has been evaluated. Recommended to avoid NSAIDs.   Proceed with upper endoscopy +/- dilation in the near future with Dr. Darrick PennaFields. The risks, benefits, and alternatives have been discussed in detail with patient. They have stated understanding and desire to proceed.  PROPOFOL due to polypharmacy  Continue Prilosec for now

## 2016-10-09 NOTE — Assessment & Plan Note (Addendum)
Worsening rectal bleeding, known internal hemorrhoids on colonoscopy in 2015. However, she has a family history of multiple polyps, and she is unsure about colon cancer status of mother, who passed away from metastatic disease of unknown primary and had multiple colonic polyps. We discussed a flex sig with banding vs colonoscopy. At this point, as it has been 3 years, I would feel more comfortable if she had a complete colonoscopy with hemorrhoid banding as appropriate at time of procedure instead of just a flex sig. She is agreeable to this.  Proceed with colonoscopy with Dr. Darrick PennaFields in the near future. The risks, benefits, and alternatives have been discussed in detail with the patient. They state understanding and desire to proceed.  Hemorrhoid banding as appropriate PROPOFOL due to polypharmacy She has noted most improvement with constipation using OTC Senna or docusate. Continue this, as they have done better than Amitiza/linzess historically. Avoid straining.

## 2016-10-12 ENCOUNTER — Encounter: Payer: Self-pay | Admitting: Gastroenterology

## 2016-10-12 NOTE — Telephone Encounter (Signed)
appt made and letter sent  °

## 2016-10-19 NOTE — Patient Instructions (Addendum)
Jeanette Cummings  10/19/2016     @PREFPERIOPPHARMACY @   Your procedure is scheduled on  10/27/2016   Report to [name] at  1115  A.M.  Call this number if you have problems the morning of surgery:  [phone]   Remember:  Do not eat food or drink liquids after midnight.  Take these medicines the morning of surgery with A SIP OF WATER  Plaquenil, prilosec, xantac, zanaflex, verapamil.   Do not wear jewelry, make-up or nail polish.  Do not wear lotions, powders, or perfumes, or deoderant.  Do not shave 48 hours prior to surgery.  Men may shave face and neck.  Do not bring valuables to the hospital.  [facility] is not responsible for any belongings or valuables.  Contacts, dentures or bridgework may not be worn into surgery.  Leave your suitcase in the car.  After surgery it may be brought to your room.  For patients admitted to the hospital, discharge time will be determined by your treatment team.  Patients discharged the day of surgery will not be allowed to drive home.   Name and phone number of your driver:  family Special instructions:  Follow the diet and prep instructions given to you by Dr Evelina Dun office.  Please read over the following fact sheets that you were given. Anesthesia Post-op Instructions and Care and Recovery After Surgery       Esophagogastroduodenoscopy Esophagogastroduodenoscopy (EGD) is a procedure to examine the lining of the esophagus, stomach, and first part of the small intestine (duodenum). This procedure is done to check for problems such as inflammation, bleeding, ulcers, or growths. During this procedure, a long, flexible, lighted tube with a camera attached (endoscope) is inserted down the throat. Tell a health care provider about:  Any allergies you have.  All medicines you are taking, including vitamins, herbs, eye drops, creams, and over-the-counter medicines.  Any problems you or family members have had  with anesthetic medicines.  Any blood disorders you have.  Any surgeries you have had.  Any medical conditions you have.  Whether you are pregnant or may be pregnant. What are the risks? Generally, this is a safe procedure. However, problems may occur, including:  Infection.  Bleeding.  A tear (perforation) in the esophagus, stomach, or duodenum.  Trouble breathing.  Excessive sweating.  Spasms of the larynx.  A slowed heartbeat.  Low blood pressure. What happens before the procedure?  Follow instructions from your health care provider about eating or drinking restrictions.  Ask your health care provider about:  Changing or stopping your regular medicines. This is especially important if you are taking diabetes medicines or blood thinners.  Taking medicines such as aspirin and ibuprofen. These medicines can thin your blood. Do not take these medicines before your procedure if your health care provider instructs you not to.  Plan to have someone take you home after the procedure.  If you wear dentures, be ready to remove them before the procedure. What happens during the procedure?  To reduce your risk of infection, your health care team will wash or sanitize their hands.  An IV tube will be put in a vein in your hand or arm. You will get medicines and fluids through this tube.  You will be given one or more of the following:  A medicine to help you relax (sedative).  A medicine to numb the area (  local anesthetic). This medicine may be sprayed into your throat. It will make you feel more comfortable and keep you from gagging or coughing during the procedure.  A medicine for pain.  A mouth guard may be placed in your mouth to protect your teeth and to keep you from biting on the endoscope.  You will be asked to lie on your left side.  The endoscope will be lowered down your throat into your esophagus, stomach, and duodenum.  Air will be put into the  endoscope. This will help your health care provider see better.  The lining of your esophagus, stomach, and duodenum will be examined.  Your health care provider may:  Take a tissue sample so it can be looked at in a lab (biopsy).  Remove growths.  Remove objects (foreign bodies) that are stuck.  Treat any bleeding with medicines or other devices that stop tissue from bleeding.  Widen (dilate) or stretch narrowed areas of your esophagus and stomach.  The endoscope will be taken out. The procedure may vary among health care providers and hospitals. What happens after the procedure?  Your blood pressure, heart rate, breathing rate, and blood oxygen level will be monitored often until the medicines you were given have worn off.  Do not eat or drink anything until the numbing medicine has worn off and your gag reflex has returned. This information is not intended to replace advice given to you by your health care provider. Make sure you discuss any questions you have with your health care provider. Document Released: 11/13/2004 Document Revised: 12/19/2015 Document Reviewed: 06/06/2015 Elsevier Interactive Patient Education  2017 [employer] Esophagogastroduodenoscopy, Care After Refer to this sheet in the next few weeks. These instructions provide you with information about caring for yourself after your procedure. Your health care provider may also give you more specific instructions. Your treatment has been planned according to current medical practices, but problems sometimes occur. Call your health care provider if you have any problems or questions after your procedure. What can I expect after the procedure? After the procedure, it is common to have:  A sore throat.  Nausea.  Bloating.  Dizziness.  Fatigue. Follow these instructions at home:  Do not eat or drink anything until the numbing medicine (local anesthetic) has worn off and your gag reflex has returned. You  will know that the local anesthetic has worn off when you can swallow comfortably.  Do not drive for 24 hours if you received a medicine to help you relax (sedative).  If your health care provider took a tissue sample for testing during the procedure, make sure to get your test results. This is your responsibility. Ask your health care provider or the department performing the test when your results will be ready.  Keep all follow-up visits as told by your health care provider. This is important. Contact a health care provider if:  You cannot stop coughing.  You are not urinating.  You are urinating less than usual. Get help right away if:  You have trouble swallowing.  You cannot eat or drink.  You have throat or chest pain that gets worse.  You are dizzy or light-headed.  You faint.  You have nausea or vomiting.  You have chills.  You have a fever.  You have severe abdominal pain.  You have black, tarry, or bloody stools. This information is not intended to replace advice given to you by your health care provider. Make sure you discuss any  questions you have with your health care provider. Document Released: 06/29/2012 Document Revised: 12/19/2015 Document Reviewed: 06/06/2015 Elsevier Interactive Patient Education  2017 [employer]  Esophageal Dilatation Esophageal dilatation is a procedure to open a blocked or narrowed part of the esophagus. The esophagus is the long tube in your throat that carries food and liquid from your mouth to your stomach. The procedure is also called esophageal dilation. You may need this procedure if you have a buildup of scar tissue in your esophagus that makes it difficult, painful, or even impossible to swallow. This can be caused by gastroesophageal reflux disease (GERD). In rare cases, people need this procedure because they have cancer of the esophagus or a problem with the way food moves through the esophagus. Sometimes you may need to  have another dilatation to enlarge the opening of the esophagus gradually. Tell a health care provider about:  Any allergies you have.  All medicines you are taking, including vitamins, herbs, eye drops, creams, and over-the-counter medicines.  Any problems you or family members have had with anesthetic medicines.  Any blood disorders you have.  Any surgeries you have had.  Any medical conditions you have.  Any antibiotic medicines you are required to take before dental procedures. What are the risks? Generally, this is a safe procedure. However, problems can occur and include:  Bleeding from a tear in the lining of the esophagus.  A hole (perforation) in the esophagus. What happens before the procedure?  Do not eat or drink anything after midnight on the night before the procedure or as directed by your health care provider.  Ask your health care provider about changing or stopping your regular medicines. This is especially important if you are taking diabetes medicines or blood thinners.  Plan to have someone take you home after the procedure. What happens during the procedure?  You will be given a medicine that makes you relaxed and sleepy (sedative).  A medicine may be sprayed or gargled to numb the back of the throat.  Your health care provider can use various instruments to do an esophageal dilatation. During the procedure, the instrument used will be placed in your mouth and passed down into your esophagus. Options include:  Simple dilators. This instrument is carefully placed in the esophagus to stretch it.  Guided wire bougies. In this method, a flexible tube (endoscope) is used to insert a wire into the esophagus. The dilator is passed over this wire to enlarge the esophagus. Then the wire is removed.  Balloon dilators. An endoscope with a small balloon at the end is passed down into the esophagus. Inflating the balloon gently stretches the esophagus and opens it  up. What happens after the procedure?  Your blood pressure, heart rate, breathing rate, and blood oxygen level will be monitored often until the medicines you were given have worn off.  Your throat may feel slightly sore and will probably still feel numb. This will improve slowly over time.  You will not be allowed to eat or drink until the throat numbness has resolved.  If this is a same-day procedure, you may be allowed to go home once you have been able to drink, urinate, and sit on the edge of the bed without nausea or dizziness.  If this is a same-day procedure, you should have a friend or family member with you for the next 24 hours after the procedure. This information is not intended to replace advice given to you by your health  care provider. Make sure you discuss any questions you have with your health care provider. Document Released: 09/03/2005 Document Revised: 12/19/2015 Document Reviewed: 11/22/2013 Elsevier Interactive Patient Education  2017 [employer]  Colonoscopy, Adult A colonoscopy is an exam to look at the entire large intestine. During the exam, a lubricated, bendable tube is inserted into the anus and then passed into the rectum, colon, and other parts of the large intestine. A colonoscopy is often done as a part of normal colorectal screening or in response to certain symptoms, such as anemia, persistent diarrhea, abdominal pain, and blood in the stool. The exam can help screen for and diagnose medical problems, including:  Tumors.  Polyps.  Inflammation.  Areas of bleeding. Tell a health care provider about:  Any allergies you have.  All medicines you are taking, including vitamins, herbs, eye drops, creams, and over-the-counter medicines.  Any problems you or family members have had with anesthetic medicines.  Any blood disorders you have.  Any surgeries you have had.  Any medical conditions you have.  Any problems you have had passing  stool. What are the risks? Generally, this is a safe procedure. However, problems may occur, including:  Bleeding.  A tear in the intestine.  A reaction to medicines given during the exam.  Infection (rare). What happens before the procedure? Eating and drinking restrictions  Follow instructions from your health care provider about eating and drinking, which may include:  A few days before the procedure - follow a low-fiber diet. Avoid nuts, seeds, dried fruit, raw fruits, and vegetables.  1-3 days before the procedure - follow a clear liquid diet. Drink only clear liquids, such as clear broth or bouillon, black coffee or tea, clear juice, clear soft drinks or sports drinks, gelatin dessert, and popsicles. Avoid any liquids that contain red or purple dye.  On the day of the procedure - do not eat or drink anything during the 2 hours before the procedure, or within the time period that your health care provider recommends. Bowel prep  If you were prescribed an oral bowel prep to clean out your colon:  Take it as told by your health care provider. Starting the day before your procedure, you will need to drink a large amount of medicated liquid. The liquid will cause you to have multiple loose stools until your stool is almost clear or light green.  If your skin or anus gets irritated from diarrhea, you may use these to relieve the irritation:  Medicated wipes, such as adult wet wipes with aloe and vitamin E.  A skin soothing-product like petroleum jelly.  If you vomit while drinking the bowel prep, take a break for up to 60 minutes and then begin the bowel prep again. If vomiting continues and you cannot take the bowel prep without vomiting, call your health care provider. General instructions   Ask your health care provider about changing or stopping your regular medicines. This is especially important if you are taking diabetes medicines or blood thinners.  Plan to have someone  take you home from the hospital or clinic. What happens during the procedure?  An IV tube may be inserted into one of your veins.  You will be given medicine to help you relax (sedative).  To reduce your risk of infection:  Your health care team will wash or sanitize their hands.  Your anal area will be washed with soap.  You will be asked to lie on your side with your knees  bent.  Your health care provider will lubricate a long, thin, flexible tube. The tube will have a camera and a light on the end.  The tube will be inserted into your anus.  The tube will be gently eased through your rectum and colon.  Air will be delivered into your colon to keep it open. You may feel some pressure or cramping.  The camera will be used to take images during the procedure.  A small tissue sample may be removed from your body to be examined under a microscope (biopsy). If any potential problems are found, the tissue will be sent to a lab for testing.  If small polyps are found, your health care provider may remove them and have them checked for cancer cells.  The tube that was inserted into your anus will be slowly removed. The procedure may vary among health care providers and hospitals. What happens after the procedure?  Your blood pressure, heart rate, breathing rate, and blood oxygen level will be monitored until the medicines you were given have worn off.  Do not drive for 24 hours after the exam.  You may have a small amount of blood in your stool.  You may pass gas and have mild abdominal cramping or bloating due to the air that was used to inflate your colon during the exam.  It is up to you to get the results of your procedure. Ask your health care provider, or the department performing the procedure, when your results will be ready. This information is not intended to replace advice given to you by your health care provider. Make sure you discuss any questions you have with your  health care provider. Document Released: 07/10/2000 Document Revised: 05/13/2016 Document Reviewed: 09/24/2015 Elsevier Interactive Patient Education  2017 [employer]  Colonoscopy, Adult, Care After This sheet gives you information about how to care for yourself after your procedure. Your health care provider may also give you more specific instructions. If you have problems or questions, contact your health care provider. What can I expect after the procedure? After the procedure, it is common to have:  A small amount of blood in your stool for 24 hours after the procedure.  Some gas.  Mild abdominal cramping or bloating. Follow these instructions at home: General instructions    For the first 24 hours after the procedure:  Do not drive or use machinery.  Do not sign important documents.  Do not drink alcohol.  Do your regular daily activities at a slower pace than normal.  Eat soft, easy-to-digest foods.  Rest often.  Take over-the-counter or prescription medicines only as told by your health care provider.  It is up to you to get the results of your procedure. Ask your health care provider, or the department performing the procedure, when your results will be ready. Relieving cramping and bloating   Try walking around when you have cramps or feel bloated.  Apply heat to your abdomen as told by your health care provider. Use a heat source that your health care provider recommends, such as a moist heat pack or a heating pad.  Place a towel between your skin and the heat source.  Leave the heat on for 20-30 minutes.  Remove the heat if your skin turns bright red. This is especially important if you are unable to feel pain, heat, or cold. You may have a greater risk of getting burned. Eating and drinking   Drink enough fluid to keep your  urine clear or pale yellow.  Resume your normal diet as instructed by your health care provider. Avoid heavy or fried foods that  are hard to digest.  Avoid drinking alcohol for as long as instructed by your health care provider. Contact a health care provider if:  You have blood in your stool 2-3 days after the procedure. Get help right away if:  You have more than a small spotting of blood in your stool.  You pass large blood clots in your stool.  Your abdomen is swollen.  You have nausea or vomiting.  You have a fever.  You have increasing abdominal pain that is not relieved with medicine. This information is not intended to replace advice given to you by your health care provider. Make sure you discuss any questions you have with your health care provider. Document Released: 02/25/2004 Document Revised: 04/06/2016 Document Reviewed: 09/24/2015 Elsevier Interactive Patient Education  2017 [employer]  Monitored Anesthesia Care Anesthesia is a term that refers to techniques, procedures, and medicines that help a person stay safe and comfortable during a medical procedure. Monitored anesthesia care, or sedation, is one type of anesthesia. Your anesthesia specialist may recommend sedation if you will be having a procedure that does not require you to be unconscious, such as:  Cataract surgery.  A dental procedure.  A biopsy.  A colonoscopy. During the procedure, you may receive a medicine to help you relax (sedative). There are three levels of sedation:  Mild sedation. At this level, you may feel awake and relaxed. You will be able to follow directions.  Moderate sedation. At this level, you will be sleepy. You may not remember the procedure.  Deep sedation. At this level, you will be asleep. You will not remember the procedure. The more medicine you are given, the deeper your level of sedation will be. Depending on how you respond to the procedure, the anesthesia specialist may change your level of sedation or the type of anesthesia to fit your needs. An anesthesia specialist will monitor you closely  during the procedure. Let your health care provider know about:  Any allergies you have.  All medicines you are taking, including vitamins, herbs, eye drops, creams, and over-the-counter medicines.  Any use of steroids (by mouth or as a cream).  Any problems you or family members have had with sedatives and anesthetic medicines.  Any blood disorders you have.  Any surgeries you have had.  Any medical conditions you have, such as sleep apnea.  Whether you are pregnant or may be pregnant.  Any use of cigarettes, alcohol, or street drugs. What are the risks? Generally, this is a safe procedure. However, problems may occur, including:  Getting too much medicine (oversedation).  Nausea.  Allergic reaction to medicines.  Trouble breathing. If this happens, a breathing tube may be used to help with breathing. It will be removed when you are awake and breathing on your own.  Heart trouble.  Lung trouble. Before the procedure Staying hydrated  Follow instructions from your health care provider about hydration, which may include:  Up to 2 hours before the procedure - you may continue to drink clear liquids, such as water, clear fruit juice, black coffee, and plain tea. Eating and drinking restrictions  Follow instructions from your health care provider about eating and drinking, which may include:  8 hours before the procedure - stop eating heavy meals or foods such as meat, fried foods, or fatty foods.  6 hours before the  procedure - stop eating light meals or foods, such as toast or cereal.  6 hours before the procedure - stop drinking milk or drinks that contain milk.  2 hours before the procedure - stop drinking clear liquids. Medicines  Ask your health care provider about:  Changing or stopping your regular medicines. This is especially important if you are taking diabetes medicines or blood thinners.  Taking medicines such as aspirin and ibuprofen. These medicines  can thin your blood. Do not take these medicines before your procedure if your health care provider instructs you not to. Tests and exams  You will have a physical exam.  You may have blood tests done to show:  How well your kidneys and liver are working.  How well your blood can clot.  General instructions  Plan to have someone take you home from the hospital or clinic.  If you will be going home right after the procedure, plan to have someone with you for 24 hours. What happens during the procedure?  Your blood pressure, heart rate, breathing, level of pain and overall condition will be monitored.  An IV tube will be inserted into one of your veins.  Your anesthesia specialist will give you medicines as needed to keep you comfortable during the procedure. This may mean changing the level of sedation.  The procedure will be performed. After the procedure  Your blood pressure, heart rate, breathing rate, and blood oxygen level will be monitored until the medicines you were given have worn off.  Do not drive for 24 hours if you received a sedative.  You may:  Feel sleepy, clumsy, or nauseous.  Feel forgetful about what happened after the procedure.  Have a sore throat if you had a breathing tube during the procedure.  Vomit. This information is not intended to replace advice given to you by your health care provider. Make sure you discuss any questions you have with your health care provider. Document Released: 04/08/2005 Document Revised: 12/20/2015 Document Reviewed: 11/03/2015 Elsevier Interactive Patient Education  2017 [employer] Monitored Anesthesia Care, Care After These instructions provide you with information about caring for yourself after your procedure. Your health care provider may also give you more specific instructions. Your treatment has been planned according to current medical practices, but problems sometimes occur. Call your health care provider if  you have any problems or questions after your procedure. What can I expect after the procedure? After your procedure, it is common to:  Feel sleepy for several hours.  Feel clumsy and have poor balance for several hours.  Feel forgetful about what happened after the procedure.  Have poor judgment for several hours.  Feel nauseous or vomit.  Have a sore throat if you had a breathing tube during the procedure. Follow these instructions at home: For at least 24 hours after the procedure:    Do not:  Participate in activities in which you could fall or become injured.  Drive.  Use heavy machinery.  Drink alcohol.  Take sleeping pills or medicines that cause drowsiness.  Make important decisions or sign legal documents.  Take care of children on your own.  Rest. Eating and drinking   Follow the diet that is recommended by your health care provider.  If you vomit, drink water, juice, or soup when you can drink without vomiting.  Make sure you have little or no nausea before eating solid foods. General instructions   Have a responsible adult stay with you until you  are awake and alert.  Take over-the-counter and prescription medicines only as told by your health care provider.  If you smoke, do not smoke without supervision.  Keep all follow-up visits as told by your health care provider. This is important. Contact a health care provider if:  You keep feeling nauseous or you keep vomiting.  You feel light-headed.  You develop a rash.  You have a fever. Get help right away if:  You have trouble breathing. This information is not intended to replace advice given to you by your health care provider. Make sure you discuss any questions you have with your health care provider. Document Released: 11/03/2015 Document Revised: 03/04/2016 Document Reviewed: 11/03/2015 Elsevier Interactive Patient Education  2017 [employer].

## 2016-10-21 ENCOUNTER — Encounter (HOSPITAL_COMMUNITY)
Admission: RE | Admit: 2016-10-21 | Discharge: 2016-10-21 | Disposition: A | Discharge status: Home / Self Care | Payer: BLUE CROSS/BLUE SHIELD | Source: Ambulatory Visit | Attending: Gastroenterology | Admitting: Gastroenterology

## 2016-10-21 ENCOUNTER — Encounter (HOSPITAL_COMMUNITY): Payer: Self-pay

## 2016-10-21 ENCOUNTER — Other Ambulatory Visit (HOSPITAL_COMMUNITY): Payer: BLUE CROSS/BLUE SHIELD

## 2016-10-21 DIAGNOSIS — R1310 Dysphagia, unspecified: Secondary | ICD-10-CM | POA: Diagnosis not present

## 2016-10-21 DIAGNOSIS — Z01812 Encounter for preprocedural laboratory examination: Secondary | ICD-10-CM | POA: Diagnosis not present

## 2016-10-21 DIAGNOSIS — K625 Hemorrhage of anus and rectum: Secondary | ICD-10-CM | POA: Diagnosis not present

## 2016-10-21 DIAGNOSIS — Z01818 Encounter for other preprocedural examination: Secondary | ICD-10-CM | POA: Insufficient documentation

## 2016-10-21 DIAGNOSIS — R1013 Epigastric pain: Secondary | ICD-10-CM | POA: Insufficient documentation

## 2016-10-21 HISTORY — DX: Gastro-esophageal reflux disease without esophagitis: K21.9

## 2016-10-21 HISTORY — DX: Polyneuropathy, unspecified: G62.9

## 2016-10-21 HISTORY — DX: Laryngeal spasm: J38.5

## 2016-10-21 LAB — BASIC METABOLIC PANEL
Anion gap: 4 — ABNORMAL LOW (ref 5–15)
BUN: 21 mg/dL — AB (ref 6–20)
CHLORIDE: 111 mmol/L (ref 101–111)
CO2: 21 mmol/L — AB (ref 22–32)
Calcium: 8.8 mg/dL — ABNORMAL LOW (ref 8.9–10.3)
Creatinine, Ser: 0.81 mg/dL (ref 0.44–1.00)
GFR calc Af Amer: >60 mL/min (ref >60)
GFR calc non Af Amer: >60 mL/min (ref >60)
GLUCOSE: 69 mg/dL (ref 65–99)
POTASSIUM: 3.7 mmol/L (ref 3.5–5.1)
SODIUM: 136 mmol/L (ref 135–145)

## 2016-10-21 LAB — CBC WITH DIFFERENTIAL/PLATELET
Basophils Absolute: 0 10*3/uL (ref 0.0–0.1)
Basophils Relative: 0 %
EOS ABS: 0.1 10*3/uL (ref 0.0–0.7)
EOS PCT: 1 %
HCT: 38.1 % (ref 36.0–46.0)
Hemoglobin: 12.7 g/dL (ref 12.0–15.0)
LYMPHS ABS: 1.8 10*3/uL (ref 0.7–4.0)
Lymphocytes Relative: 30 %
MCH: 29.5 pg (ref 26.0–34.0)
MCHC: 33.3 g/dL (ref 30.0–36.0)
MCV: 88.4 fL (ref 78.0–100.0)
Monocytes Absolute: 0.4 10*3/uL (ref 0.1–1.0)
Monocytes Relative: 6 %
Neutro Abs: 3.8 10*3/uL (ref 1.7–7.7)
Neutrophils Relative %: 63 %
PLATELETS: 256 10*3/uL (ref 150–400)
RBC: 4.31 MIL/uL (ref 3.87–5.11)
RDW: 12.9 % (ref 11.5–15.5)
WBC: 6 10*3/uL (ref 4.0–10.5)

## 2016-10-21 LAB — HCG, SERUM, QUALITATIVE: Preg, Serum: NEGATIVE

## 2016-10-27 ENCOUNTER — Encounter (HOSPITAL_COMMUNITY): Payer: Self-pay | Admitting: *Deleted

## 2016-10-27 ENCOUNTER — Ambulatory Visit (HOSPITAL_COMMUNITY)
Admission: RE | Admit: 2016-10-27 | Discharge: 2016-10-27 | Disposition: A | Discharge status: Home / Self Care | Payer: BLUE CROSS/BLUE SHIELD | Source: Ambulatory Visit | Attending: Gastroenterology | Admitting: Gastroenterology

## 2016-10-27 ENCOUNTER — Encounter (HOSPITAL_COMMUNITY)
Admission: RE | Disposition: A | Discharge status: Home / Self Care | Payer: Self-pay | Source: Ambulatory Visit | Attending: Gastroenterology

## 2016-10-27 ENCOUNTER — Ambulatory Visit (HOSPITAL_COMMUNITY): Payer: BLUE CROSS/BLUE SHIELD | Admitting: Anesthesiology

## 2016-10-27 ENCOUNTER — Encounter (HOSPITAL_COMMUNITY): Payer: Self-pay

## 2016-10-27 ENCOUNTER — Ambulatory Visit (HOSPITAL_COMMUNITY): Admit: 2016-10-27 | Payer: BLUE CROSS/BLUE SHIELD | Admitting: Gastroenterology

## 2016-10-27 DIAGNOSIS — R197 Diarrhea, unspecified: Secondary | ICD-10-CM

## 2016-10-27 DIAGNOSIS — K621 Rectal polyp: Secondary | ICD-10-CM | POA: Insufficient documentation

## 2016-10-27 DIAGNOSIS — Z8371 Family history of colonic polyps: Secondary | ICD-10-CM | POA: Diagnosis not present

## 2016-10-27 DIAGNOSIS — R1013 Epigastric pain: Secondary | ICD-10-CM | POA: Diagnosis not present

## 2016-10-27 DIAGNOSIS — K644 Residual hemorrhoidal skin tags: Secondary | ICD-10-CM | POA: Diagnosis not present

## 2016-10-27 DIAGNOSIS — K6389 Other specified diseases of intestine: Secondary | ICD-10-CM | POA: Diagnosis not present

## 2016-10-27 DIAGNOSIS — R1033 Periumbilical pain: Secondary | ICD-10-CM | POA: Insufficient documentation

## 2016-10-27 DIAGNOSIS — K635 Polyp of colon: Secondary | ICD-10-CM | POA: Diagnosis not present

## 2016-10-27 DIAGNOSIS — M3500 Sicca syndrome, unspecified: Secondary | ICD-10-CM | POA: Insufficient documentation

## 2016-10-27 DIAGNOSIS — I69311 Memory deficit following cerebral infarction: Secondary | ICD-10-CM | POA: Diagnosis not present

## 2016-10-27 DIAGNOSIS — K649 Unspecified hemorrhoids: Secondary | ICD-10-CM

## 2016-10-27 DIAGNOSIS — K625 Hemorrhage of anus and rectum: Secondary | ICD-10-CM | POA: Diagnosis not present

## 2016-10-27 DIAGNOSIS — K219 Gastro-esophageal reflux disease without esophagitis: Secondary | ICD-10-CM | POA: Diagnosis not present

## 2016-10-27 DIAGNOSIS — K648 Other hemorrhoids: Secondary | ICD-10-CM | POA: Insufficient documentation

## 2016-10-27 DIAGNOSIS — Z79899 Other long term (current) drug therapy: Secondary | ICD-10-CM | POA: Insufficient documentation

## 2016-10-27 DIAGNOSIS — R1310 Dysphagia, unspecified: Secondary | ICD-10-CM | POA: Diagnosis not present

## 2016-10-27 HISTORY — PX: COLONOSCOPY WITH PROPOFOL: SHX5780

## 2016-10-27 HISTORY — PX: ESOPHAGOGASTRODUODENOSCOPY (EGD) WITH PROPOFOL: SHX5813

## 2016-10-27 HISTORY — PX: SAVORY DILATION: SHX5439

## 2016-10-27 HISTORY — PX: BIOPSY: SHX5522

## 2016-10-27 HISTORY — PX: POLYPECTOMY: SHX5525

## 2016-10-27 SURGERY — SIGMOIDOSCOPY, FLEXIBLE
Anesthesia: Monitor Anesthesia Care

## 2016-10-27 SURGERY — COLONOSCOPY WITH PROPOFOL
Anesthesia: Monitor Anesthesia Care

## 2016-10-27 MED ORDER — CHLORHEXIDINE GLUCONATE CLOTH 2 % EX PADS: 6.0000 | MEDICATED_PAD | Freq: Once | CUTANEOUS | Status: DC

## 2016-10-27 MED ORDER — FENTANYL CITRATE (PF) 100 MCG/2ML IJ SOLN
25.0000 ug | INTRAMUSCULAR | Status: AC
  Administered 2016-10-27: 25 ug via INTRAVENOUS

## 2016-10-27 MED ORDER — MIDAZOLAM HCL 2 MG/2ML IJ SOLN
1.0000 mg | INTRAMUSCULAR | Status: AC
  Administered 2016-10-27: 2 mg via INTRAVENOUS

## 2016-10-27 MED ORDER — MINERAL OIL PO OIL
TOPICAL_OIL | ORAL | Status: AC
  Filled 2016-10-27: qty 30

## 2016-10-27 MED ORDER — OMEPRAZOLE 20 MG PO CPDR: 20.0000 mg | DELAYED_RELEASE_CAPSULE | Freq: Two times a day (BID) | ORAL | 11 refills | Status: AC

## 2016-10-27 MED ORDER — MIDAZOLAM HCL 2 MG/2ML IJ SOLN
INTRAMUSCULAR | Status: AC
  Filled 2016-10-27: qty 2

## 2016-10-27 MED ORDER — LACTATED RINGERS IV SOLN
INTRAVENOUS | Status: DC
  Administered 2016-10-27: 12:00:00 via INTRAVENOUS

## 2016-10-27 MED ORDER — LIDOCAINE VISCOUS 2 % MT SOLN
OROMUCOSAL | Status: AC
  Filled 2016-10-27: qty 15

## 2016-10-27 MED ORDER — PROPOFOL 500 MG/50ML IV EMUL
INTRAVENOUS | Status: DC | PRN
  Administered 2016-10-27: 13:00:00 via INTRAVENOUS
  Administered 2016-10-27: 150 ug/kg/min via INTRAVENOUS
  Administered 2016-10-27 (×2): via INTRAVENOUS

## 2016-10-27 MED ORDER — LIDOCAINE HCL (CARDIAC) 10 MG/ML IV SOLN
INTRAVENOUS | Status: DC | PRN
  Administered 2016-10-27: 50 mg via INTRAVENOUS

## 2016-10-27 MED ORDER — FENTANYL CITRATE (PF) 100 MCG/2ML IJ SOLN
INTRAMUSCULAR | Status: AC
  Filled 2016-10-27: qty 2

## 2016-10-27 MED ORDER — PROPOFOL 10 MG/ML IV BOLUS
INTRAVENOUS | Status: AC
  Filled 2016-10-27: qty 20

## 2016-10-27 MED ORDER — LIDOCAINE VISCOUS 2 % MT SOLN
5.0000 mL | Freq: Once | OROMUCOSAL | Status: AC
  Administered 2016-10-27: 5 mL via OROMUCOSAL

## 2016-10-27 NOTE — Anesthesia Postprocedure Evaluation (Signed)
Anesthesia Post Note  Patient: Jeanette Cummings  Procedure(s) Performed: Procedure(s) (LRB): COLONOSCOPY WITH PROPOFOL (N/A) ESOPHAGOGASTRODUODENOSCOPY (EGD) WITH PROPOFOL (N/A) SAVORY DILATION (N/A) BIOPSY POLYPECTOMY  Patient location during evaluation: PACU Anesthesia Type: MAC Level of consciousness: awake and alert and oriented Pain management: pain level controlled Vital Signs Assessment: post-procedure vital signs reviewed and stable Respiratory status: spontaneous breathing Cardiovascular status: stable Postop Assessment: no signs of nausea or vomiting Anesthetic complications: no     Last Vitals:  Vitals:   10/27/16 1205 10/27/16 1210  BP: 110/63 106/69  Resp: (!) 34 (!) 22    Last Pain: There were no vitals filed for this visit.               ADAMS, AMY A

## 2016-10-27 NOTE — Transfer of Care (Signed)
Immediate Anesthesia Transfer of Care Note  Patient: Jeanette Cummings  Procedure(s) Performed: Procedure(s) with comments: COLONOSCOPY WITH PROPOFOL (N/A) - 12:45pm ESOPHAGOGASTRODUODENOSCOPY (EGD) WITH PROPOFOL (N/A) SAVORY DILATION (N/A) BIOPSY - colon POLYPECTOMY - rectum  Patient Location: PACU  Anesthesia Type:MAC  Level of Consciousness: awake, alert , oriented and patient cooperative  Airway & Oxygen Therapy: Patient Spontanous Breathing and Patient connected to nasal cannula oxygen  Post-op Assessment: Report given to RN and Post -op Vital signs reviewed and stable  Post vital signs: Reviewed and stable  Last Vitals:  Vitals:   10/27/16 1205 10/27/16 1210  BP: 110/63 106/69  Resp: (!) 34 (!) 22    Last Pain: There were no vitals filed for this visit.       Complications: No apparent anesthesia complications

## 2016-10-27 NOTE — H&P (Signed)
Primary Care Physician:  Glori Bickers, MD Primary Gastroenterologist:  Dr. Darrick Penna  Pre-Procedure History & Physical: HPI:  Jeanette Cummings is a 46 y.o. female here for BRBPR/ABDOMINAL PAIN/dysphagia.  Past Medical History:  Diagnosis Date  . 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 Frequent headaches   . GERD (gastroesophageal reflux disease)   . History of cervical biopsy    multiple  . Hyperthyroidism   . 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 Multiple thyroid nodules   . Neuropathy (HCC)   . Sjogren's disease (HCC)   . 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 Vasculitis [facility])     Past Surgical History:  Procedure Laterality Date  . CERVICAL CONE BIOPSY     "several".  . COLONOSCOPY N/A 09/01/2013   Dr. Darrick Penna- normal mucosa in the terminal ileum. the colon is redundant, small internal hemorrhoids.  . ESOPHAGOGASTRODUODENOSCOPY N/A 09/01/2013   Dr.Cilicia Borden- esophagus appeared normal, small hiatal hernia, moderate non-erosive gastritis found in gastric antrum. duodenum= no abnormalities bx= minimal chronic infammation.  Marland Kitchen FOOT SURGERY Bilateral    bunionectomy  . history of thyroid radiation  2008  . parathyroidectomy  2014  . THYMECTOMY  03/09/05  . WISDOM TOOTH EXTRACTION      Prior to Admission medications   Medication Sig Start Date End Date Taking? Authorizing Provider  acetaminophen (TYLENOL) 500 MG tablet Take 1,000 mg by mouth 2 (two) times daily.    Yes Historical Provider, MD  Alpha Lipoic Acid 200 MG CAPS Take 200 mg by mouth daily.   Yes Historical Provider, MD  Ascorbic Acid (VITAMIN C) 1000 MG tablet Take 1,000 mg by mouth daily.   Yes Historical Provider, MD  Biotin 16109 MCG TBDP Take 10,000 mcg by mouth daily.    Yes Historical Provider, MD  calcium-vitamin D (OSCAL WITH D) 250-125 MG-UNIT per tablet Take 1 tablet by mouth daily.    Yes Historical Provider, MD  Cholecalciferol (VITAMIN D3) 5000 units CAPS Take 5,000 Units by  mouth daily.   Yes Historical Provider, MD  cycloSPORINE (RESTASIS) 0.05 % ophthalmic emulsion Place 1 drop into both eyes 2 (two) times daily.    Yes Historical Provider, MD  docusate sodium (COLACE) 100 MG capsule Take 100 mg by mouth 2 (two) times daily.   Yes Historical Provider, MD  fluorometholone (FML) 0.1 % ophthalmic suspension Apply 1 drop to eye 2 (two) times daily as needed (IRRITATION).  05/13/15  Yes Historical Provider, MD  Gabapentin, Once-Daily, (GRALISE) 600 MG TABS Take 2,400 mg by mouth every evening.  11/21/15  Yes Historical Provider, MD  hydroxychloroquine (PLAQUENIL) 200 MG tablet Take 400 mg by mouth daily. 09/20/16  Yes Historical Provider, MD  ibuprofen (ADVIL,MOTRIN) 200 MG tablet Take 400 mg by mouth 2 (two) times daily as needed.   Yes Historical Provider, MD  omeprazole (PRILOSEC) 20 MG capsule TAKE 1 CAPSULE (20 MG TOTAL) BY MOUTH 2 (TWO) TIMES DAILY BEFORE A MEAL. 07/22/16  Yes Tiffany Kocher, PA-C  pentoxifylline (TRENTAL) 400 MG CR tablet Take 400 mg by mouth at bedtime.  05/18/16  Yes Historical Provider, MD  Polyethyl Glycol-Propyl Glycol (SYSTANE OP) Place 2 drops into both eyes every 4 (four) hours as needed (dry eyes).    Yes Historical Provider, MD  polyethylene glycol-electrolytes (TRILYTE) 420 g solution Take 4,000 mLs by mouth as directed. Patient taking differently: Take 4,000 mLs by mouth as directed. WILL DO PRIOR TO PROCEDURE  10/08/16  Yes West Bali, MD  Probiotic Product (RESTORA) CAPS Take 1 capsule by mouth daily.   Yes Historical Provider, MD  ranitidine (ZANTAC) 150 MG tablet Take 150 mg by mouth at bedtime.  06/13/15  Yes Historical Provider, MD  tiZANidine (ZANAFLEX) 4 MG tablet Take 4 mg by mouth at bedtime as needed for muscle spasms.   Yes Historical Provider, MD  Topiramate ER (TROKENDI XR) 200 MG CP24 Take 400 mg by mouth at bedtime.    Yes Historical Provider, MD  TRULICITY 0.75 MG/0.5ML SOPN INJECT 1 PEN SUBCUTANEOULY ONCE A WEEK ON  SUNDAY 09/14/16  Yes Historical Provider, MD  verapamil (VERELAN PM) 240 MG 24 hr capsule Take 240 mg by mouth daily.  12/19/14 10/16/16 Yes Historical Provider, MD  lubiprostone (AMITIZA) 8 MCG capsule Take 1 capsule (8 mcg total) by mouth 2 (two) times daily with a meal. Patient not taking: Reported on 10/16/2016 07/08/15   Gelene Mink, NP  MedroxyPROGESTERone Acetate 150 MG/ML SUSY Inject 150 mg into the muscle every 3 (three) months. 09/03/16   Historical Provider, MD    Allergies as of 10/08/2016 - Review Complete 10/06/2016  Allergen Reaction Noted  . Sumatriptan Other (See Comments) 01/11/2014    Family History  Problem Relation Age of Onset  . Colon polyps Mother     numerous  . Breast cancer Mother     age 13s  . Cancer Mother     died from metastatic brain cancer, not from breast cancer, unknown primary  . Colon cancer Maternal Aunt     diagnosed at age 76, deceased  . Colon cancer Cousin     diagnosed at age 43, deceased age 41  . Colon polyps Cousin     precancerous, age 53s  . Breast cancer Maternal Aunt     age 54, at age 82 new primry lung cancer deceased. Sjogrens', polymyositis, hyperthyroid, scleroderma  . Breast cancer Maternal Aunt     late 57s  . Ovarian cancer Paternal Grandmother   . Ovarian cancer Cousin     age 67, paternal  . Lung cancer Maternal Grandmother     died age 45  . Other Son     IgA/IgE deficient    Social History   Social History  . Marital status: Married    Spouse name: N/A  . Number of children: 2  . Years of education: N/A   Occupational History  .      Social History Main Topics  . Smoking status: Never Smoker  . Smokeless tobacco: Never Used  . Alcohol use No  . Drug use: No  . Sexual activity: Yes    Birth control/ protection: Injection   Other Topics Concern  . Not on file   Social History Narrative  . No narrative on file    Review of Systems: See HPI, otherwise negative ROS   Physical Exam: Ht   (1.651 m)   Wt 207 lb (93.9 kg)   LMP 10/21/2016   BMI 34.45 kg/m  General:   Alert,  pleasant and cooperative in NAD Head:  Normocephalic and atraumatic. Neck:  Supple; Lungs:  Clear throughout to auscultation.    Heart:  Regular rate and rhythm. Abdomen:  Soft, nontender and nondistended. Normal bowel sounds, without guarding, and without rebound.   Neurologic:  Alert and  oriented x4;  grossly normal neurologically.  Impression/Plan:     BRBPR/ABDOMINAL PAIN/dysphagia  PLAN: EGD/TCS TODAY. DISCUSSED PROCEDURE, BENEFITS, & RISKS: <  1% chance of medication reaction, bleeding, perforation, or rupture of spleen/liver.

## 2016-10-27 NOTE — Discharge Instructions (Signed)
YOUR RECTAL BLEEDING IS DUE TO RECTAL POLYPS AND INTERNAL HEMORRHOIDS. YOU ALSO HAVE LARGE EXTERNAL HEMORRHOIDS.  I REMOVED THE TWO RECTAL POLYPS. I PLACED A CLIP TO PREVENT BLEEDING IN 7-10 DAYS.   I STRETCHED YOUR ESOPHAGUS DUE YOUR PROBLEMS SWALLOWING. YOU HAVE A SMALL HIATAL HERNIA.   NO MRI FOR 30 DAYS DUE TO METAL CLIP PLACEMENT IN THE RECTUM.  AVOID REFLUX TRIGGERS. SEE INFO BELOW.  CONTINUE OMEPRAZOLE.  TAKE 30 MINUTES PRIOR TO YOUR MEALS TWICE DAILY.  ZANTAC HELPS MOST WHEN USED AS NEEDED. USE IT AT BEDTIME MON-FRI  YOUR BIOPSY RESULTS WILL BE AVAILABLE IN MY CHART AFTER APR 6 AND MY OFFICE WILL CONTACT YOU IN 10-14 DAYS WITH YOUR RESULTS.   Follow up in 4 mos.  Next colonoscopy in 5-10 years.   ENDOSCOPY Care After Read the instructions outlined below and refer to this sheet in the next week. These discharge instructions provide you with general information on caring for yourself after you leave the hospital. While your treatment has been planned according to the most current medical practices available, unavoidable complications occasionally occur. If you have any problems or questions after discharge, call DR. Kyleeann Cremeans, [phone].  ACTIVITY  You may resume your regular activity, but move at a slower pace for the next 24 hours.   Take frequent rest periods for the next 24 hours.   Walking will help get rid of the air and reduce the bloated feeling in your belly (abdomen).   No driving for 24 hours (because of the medicine (anesthesia) used during the test).   You may shower.   Do not sign any important legal documents or operate any machinery for 24 hours (because of the anesthesia used during the test).    NUTRITION  Drink plenty of fluids.   You may resume your normal diet as instructed by your doctor.   Begin with a light meal and progress to your normal diet. Heavy or fried foods are harder to digest and may make you feel sick to your stomach (nauseated).     Avoid alcoholic beverages for 24 hours or as instructed.    MEDICATIONS  You may resume your normal medications.   WHAT YOU CAN EXPECT TODAY  Some feelings of bloating in the abdomen.   Passage of more gas than usual.   Spotting of blood in your stool or on the toilet paper  .  IF YOU HAD POLYPS REMOVED DURING THE ENDOSCOPY:  Eat a soft diet IF YOU HAVE NAUSEA, BLOATING, ABDOMINAL PAIN, OR VOMITING.    FINDING OUT THE RESULTS OF YOUR TEST Not all test results are available during your visit. DR. Darrick Penna WILL CALL YOU WITHIN 14 DAYS OF YOUR PROCEDUE WITH YOUR RESULTS. Do not assume everything is normal if you have not heard from DR. Opie Maclaughlin, CALL HER OFFICE AT [phone].  SEEK IMMEDIATE MEDICAL ATTENTION AND CALL THE OFFICE: [phone] IF:  You have more than a spotting of blood in your stool.   Your belly is swollen (abdominal distention).   You are nauseated or vomiting.   You have a temperature over 101F.   You have abdominal pain or discomfort that is severe or gets worse throughout the day.   High-Fiber Diet A high-fiber diet changes your normal diet to include more whole grains, legumes, fruits, and vegetables. Changes in the diet involve replacing refined carbohydrates with unrefined foods. The calorie level of the diet is essentially unchanged. The Dietary Reference Intake (recommended amount) for adult males  is 38 grams per day. For adult females, it is 25 grams per day. Pregnant and lactating women should consume 28 grams of fiber per day. Fiber is the intact part of a plant that is not broken down during digestion. Functional fiber is fiber that has been isolated from the plant to provide a beneficial effect in the body. PURPOSE  Increase stool bulk.   Ease and regulate bowel movements.   Lower cholesterol.   REDUCE RISK OF COLON CANCER  INDICATIONS THAT YOU NEED MORE FIBER  Constipation and hemorrhoids.   Uncomplicated diverticulosis (intestine  condition) and irritable bowel syndrome.   Weight management.   As a protective measure against hardening of the arteries (atherosclerosis), diabetes, and cancer.   GUIDELINES FOR INCREASING FIBER IN THE DIET  Start adding fiber to the diet slowly. A gradual increase of about 5 more grams (2 slices of whole-wheat bread, 2 servings of most fruits or vegetables, or 1 bowl of high-fiber cereal) per day is best. Too rapid an increase in fiber may result in constipation, flatulence, and bloating.   Drink enough water and fluids to keep your urine clear or pale yellow. Water, juice, or caffeine-free drinks are recommended. Not drinking enough fluid may cause constipation.   Eat a variety of high-fiber foods rather than one type of fiber.   Try to increase your intake of fiber through using high-fiber foods rather than fiber pills or supplements that contain small amounts of fiber.   The goal is to change the types of food eaten. Do not supplement your present diet with high-fiber foods, but replace foods in your present diet.   INCLUDE A VARIETY OF FIBER SOURCES  Replace refined and processed grains with whole grains, canned fruits with fresh fruits, and incorporate other fiber sources. White rice, white breads, and most bakery goods contain little or no fiber.   Brown whole-grain rice, buckwheat oats, and many fruits and vegetables are all good sources of fiber. These include: broccoli, Brussels sprouts, cabbage, cauliflower, beets, sweet potatoes, white potatoes (skin on), carrots, tomatoes, eggplant, squash, berries, fresh fruits, and dried fruits.   Cereals appear to be the richest source of fiber. Cereal fiber is found in whole grains and bran. Bran is the fiber-rich outer coat of cereal grain, which is largely removed in refining. In whole-grain cereals, the bran remains. In breakfast cereals, the largest amount of fiber is found in those with "bran" in their names. The fiber content is  sometimes indicated on the label.   You may need to include additional fruits and vegetables each day.   In baking, for 1 cup white flour, you may use the following substitutions:   1 cup whole-wheat flour minus 2 tablespoons.   1/2 cup white flour plus 1/2 cup whole-wheat flour.    Polyps, Colon  A polyp is extra tissue that grows inside your body. Colon polyps grow in the large intestine. The large intestine, also called the colon, is part of your digestive system. It is a long, hollow tube at the end of your digestive tract where your body makes and stores stool. Most polyps are not dangerous. They are benign. This means they are not cancerous. But over time, some types of polyps can turn into cancer. Polyps that are smaller than a pea are usually not harmful. But larger polyps could someday become or may already be cancerous. To be safe, doctors remove all polyps and test them.    PREVENTION There is not one sure  way to prevent polyps. You might be able to lower your risk of getting them if you:  Eat more fruits and vegetables and less fatty food.   Do not smoke.   Avoid alcohol.   Exercise every day.   Lose weight if you are overweight.   Eating more calcium and folate can also lower your risk of getting polyps. Some foods that are rich in calcium are milk, cheese, and broccoli. Some foods that are rich in folate are chickpeas, kidney beans, and spinach.    Hiatal Hernia A hiatal hernia occurs when a part of the stomach slides above the diaphragm. The diaphragm is the thin muscle separating the belly (abdomen) from the chest. A hiatal hernia can be something you are born with or develop over time. Hiatal hernias may allow stomach acid to flow back into your esophagus, the tube which carries food from your mouth to your stomach. If this acid causes problems it is called GERD (gastro-esophageal reflux disease).   SYMPTOMS Common symptoms of GERD are heartburn (burning in your  chest). This is worse when lying down or bending over. It may also cause belching and indigestion. Some of the things which make GERD worse are:  Increased weight pushes on stomach making acid rise more easily.   Smoking markedly increases acid production.   Alcohol decreases lower esophageal sphincter pressure (valve between stomach and esophagus), allowing acid from stomach into esophagus.   Late evening meals and going to bed with a full stomach increases pressure.        Lifestyle and home remedies TO MANAGE REFLUX/CHEST PAIN/REGURGITATION  You may eliminate or reduce the frequency of heartburn by making the following lifestyle changes:   Control your weight. Being overweight is a major risk factor for heartburn and GERD. Excess pounds put pressure on your abdomen, pushing up your stomach and causing acid to back up into your esophagus.    Eat smaller meals. 4 TO 6 MEALS A DAY. This reduces pressure on the lower esophageal sphincter, helping to prevent the valve from opening and acid from washing back into your esophagus.    Loosen your belt. Clothes that fit tightly around your waist put pressure on your abdomen and the lower esophageal sphincter.    Eliminate heartburn triggers. Everyone has specific triggers. Common triggers such as fatty or fried foods, spicy food, tomato sauce, carbonated beverages, alcohol, chocolate, mint, garlic, onion, caffeine and nicotine may make heartburn worse.    Avoid stooping or bending. Tying your shoes is OK. Bending over for longer periods to weed your garden isn't, especially soon after eating.    Don't lie down after a meal. Wait at least three to four hours after eating before going to bed, and don't lie down right after eating.    PUT THE HEAD OF YOUR BED ON 6 INCH BLOCKS.   Alternative medicine  Several home remedies exist for treating GERD, but they provide only temporary relief. They include drinking baking soda (sodium  bicarbonate) added to water or drinking other fluids such as baking soda mixed with cream of tartar and water.   Although these liquids create temporary relief by neutralizing, washing away or buffering acids, eventually they aggravate the situation by adding gas and fluid to your stomach, increasing pressure and causing more acid reflux. Further, adding more sodium to your diet may increase your blood pressure and add stress to your heart, and excessive bicarbonate ingestion can alter the acid-base balance in your body.

## 2016-10-27 NOTE — Op Note (Signed)
[facility] Patient Name: Jeanette Cummings Procedure Date: 10/27/2016 1:03 PM MRN: [id number] Date of Birth: 10-Mar-1971 Attending MD: Jonette Eva , MD CSN: [id number] Age: 46 Admit Type: Outpatient Procedure:                Upper GI endoscopy WITH COLD FORCEPS                            BIOPSY/ESOPHAGEAL DILATION Indications:              Dyspepsia, Diarrhea Providers:                Jonette Eva, MD, Loma Messing B. Patsy Lager, RN, Dyann Ruddle Referring MD:             Glori Bickers, MD Medicines:                Propofol per Anesthesia Complications:            No immediate complications. Estimated Blood Loss:     Estimated blood loss was minimal. Procedure:                Pre-Anesthesia Assessment:                           - Prior to the procedure, a History and Physical                            was performed, and patient medications and                            allergies were reviewed. The patient's tolerance of                            previous anesthesia was also reviewed. The risks                            and benefits of the procedure and the sedation                            options and risks were discussed with the patient.                            All questions were answered, and informed consent                            was obtained. Prior Anticoagulants: The patient has                            taken no previous anticoagulant or antiplatelet                            agents. ASA Grade Assessment: II - A patient with  mild systemic disease. After reviewing the risks                            and benefits, the patient was deemed in                            satisfactory condition to undergo the procedure.                            After obtaining informed consent, the endoscope was                            passed under direct vision. Throughout the                            procedure, the patient's  blood pressure, pulse, and                            oxygen saturations were monitored continuously. The                            [id number] ([id number]) scope was introduced through the                            mouth, and advanced to the second part of duodenum.                            The upper GI endoscopy was accomplished without                            difficulty. The patient tolerated the procedure                            well. Scope In: 1:06:27 PM Scope Out: 1:18:07 PM Total Procedure Duration: 0 hours 11 minutes 40 seconds  Findings:      The examined esophagus was normal. Biopsies were obtained from the       proximal and distal esophagus with cold forceps for histology of       suspected eosinophilic esophagitis.      The entire examined stomach was normal.      The examined duodenum was normal. Biopsies for histology were taken with       a cold forceps for evaluation of celiac disease. Impression:               - Normal esophagus.                           - Normal stomach.                           - Normal examined duodenum.                           - NO OBVIOUS REASON FOR PERIUMBILICAL ABDOMIANL  PAIN/DIARRHEA/DYSPHAGIA Moderate Sedation:      Per Anesthesia Care Recommendation:           - Await pathology results.                           - Resume previous diet.                           - Continue present medications.                           - Return to my office in 4 months. IF DYSPHAGIA                            PERSISTS,PT NEED EVALUATION AT [facility]                            TO INCLUDE MANOMETRY/IMPEDANCE.                           - Patient has a contact number available for                            emergencies. The signs and symptoms of potential                            delayed complications were discussed with the                            patient. Return to normal activities tomorrow.                             Written discharge instructions were provided to the                            patient. Procedure Code(s):        --- Professional ---                           [phone], Esophagogastroduodenoscopy, flexible,                            transoral; with biopsy, single or multiple Diagnosis Code(s):        --- Professional ---                           R10.13, Epigastric pain                           R19.7, Diarrhea, unspecified CPT copyright 2016 [employer]. All rights reserved. The codes documented in this report are preliminary and upon coder review may  be revised to meet current compliance requirements. Jonette Eva, MD Jonette Eva, MD 10/27/2016 1:52:28 PM This report has been signed electronically. Number of Addenda: 0

## 2016-10-27 NOTE — Anesthesia Preprocedure Evaluation (Signed)
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)  Poorly Controlled,  Endo/Other  Hyperthyroidism (treated with radioactive iodine.)   Renal/GU      Musculoskeletal   Abdominal   Peds  Hematology   Anesthesia Other Findings Sjogren's disease   Reproductive/Obstetrics                             Anesthesia Physical Anesthesia Plan  ASA: III  Anesthesia Plan: MAC   Post-op Pain Management:    Induction: Intravenous  Airway Management Planned:   Additional Equipment:   Intra-op Plan:   Post-operative Plan:   Informed Consent: I have reviewed the patients History and Physical, chart, labs and discussed the procedure including the risks, benefits and alternatives for the proposed anesthesia with the patient or authorized representative who has indicated his/her understanding and acceptance.     Plan Discussed with:   Anesthesia Plan Comments:         Anesthesia Quick Evaluation

## 2016-10-27 NOTE — Op Note (Addendum)
[facility] Patient Name: Jeanette Cummings Procedure Date: 10/27/2016 11:52 AM MRN: [id number] Date of Birth: 23-Feb-1971 Attending MD: Jonette Eva , MD CSN: [id number] Age: 46 Admit Type: Outpatient Procedure:                ColonoscopyWITH COLD FORCEPS BIOPSY & SNARE                            POLYPECTOMY Indications:              Periumbilical abdominal pain, Clinically                            significant diarrhea of unexplained origin,                            Hematochezia Providers:                Jonette Eva, MD, Jannett Celestine, RN, Toniann Fail                            RN, RN Referring MD:             Glori Bickers, MD Medicines:                Propofol per Anesthesia Complications:            No immediate complications. Estimated Blood Loss:     Estimated blood loss was minimal.                           Estimated blood loss was minimal. Procedure:                Pre-Anesthesia Assessment:                           - Prior to the procedure, a History and Physical                            was performed, and patient medications and                            allergies were reviewed. The patient's tolerance of                            previous anesthesia was also reviewed. The risks                            and benefits of the procedure and the sedation                            options and risks were discussed with the patient.                            All questions were answered, and informed consent                            was obtained. Prior Anticoagulants: The patient has  taken no previous anticoagulant or antiplatelet                            agents. ASA Grade Assessment: II - A patient with                            mild systemic disease. After reviewing the risks                            and benefits, the patient was deemed in                            satisfactory condition to undergo the procedure.                 After obtaining informed consent, the colonoscope                            was passed under direct vision. Throughout the                            procedure, the patient's blood pressure, pulse, and                            oxygen saturations were monitored continuously. The                            EC-[id number] ([id number]) scope was introduced through                            the anus and advanced to the 5 cm into the ileum.                            The terminal ileum, ileocecal valve, appendiceal                            orifice, and rectum were photographed. The                            colonoscopy was somewhat difficult due to a                            tortuous colon. Successful completion of the                            procedure was aided by COLOWRAP. The patient                            tolerated the procedure well. The quality of the                            bowel preparation was good. Scope In: 12:29:13 PM Scope Out: 1:00:52 PM Scope Withdrawal Time: 0 hours 28 minutes 0 seconds  Total Procedure Duration: 0 hours 31 minutes 39 seconds  Findings:  The digital rectal exam findings include non-thrombosed external       hemorrhoids. Pertinent negatives include normal sphincter tone.      The terminal ileum appeared normal.      The entire examined colon appeared normal. Biopsies for histology were       taken with a cold forceps from the cecum, ascending colon, transverse       colon and descending colon for evaluation of microscopic colitis.      Two sessile polyps were found in the rectum. The polyps were 5 to       9(DISTAL) mm in size. ONE DISTAL POLYP 1-2 MM ABOVE THE DENTATE LINE.       These polyps were removed with a hot snare. Resection and retrieval were       complete. To prevent bleeding post-intervention, one hemostatic clip was       successfully placed (MR conditional) AT THE BASE OF THE MOST DISTAL       POLYP. There was no  bleeding at the end of the procedure.      The rectum appeared normal. This was biopsied with a cold forceps for       histology.      Internal hemorrhoids were found during retroflexion. The hemorrhoids       were moderate.      External hemorrhoids were found during retroflexion. The hemorrhoids       were large. Impression:               - RECTAL BLEEDING DUE TO Internal hemorrhoids &                            RECTAL POLYPS.                           - External hemorrhoids. Moderate Sedation:      Per Anesthesia Care Recommendation:           - Await pathology results. SEE SURGERY TO DISCUSS                            HEMORRHOIDECTOMY.                           - Repeat colonoscopy in 5-10 years for surveillance.                           - Resume previous diet.                           - Continue present medications. USE PREPARATION H                            IF NEEDED FOR RECTAL BLEEDING/PAIN                           - Patient has a contact number available for                            emergencies. The signs and symptoms of potential  delayed complications were discussed with the                            patient. Return to normal activities tomorrow.                            Written discharge instructions were provided to the                            patient. Procedure Code(s):        --- Professional ---                           [phone], Colonoscopy, flexible; with removal of                            tumor(s), polyp(s), or other lesion(s) by snare                            technique                           45380, 59, Colonoscopy, flexible; with biopsy,                            single or multiple Diagnosis Code(s):        --- Professional ---                           K64.4, Residual hemorrhoidal skin tags                           K64.8, Other hemorrhoids                           K62.1, Rectal polyp                            R10.33, Periumbilical pain                           R19.7, Diarrhea, unspecified                           K92.1, Melena (includes Hematochezia) CPT copyright 2016 American Medical Association. All rights reserved. The codes documented in this report are preliminary and upon coder review may  be revised to meet current compliance requirements. Jonette Eva, MD Jonette Eva, MD 10/27/2016 1:37:37 PM This report has been signed electronically. Number of Addenda: 0

## 2016-10-27 NOTE — Anesthesia Procedure Notes (Signed)
Procedure Name: MAC Date/Time: 10/27/2016 12:13 PM Performed by: Vista Deck Pre-anesthesia Checklist: Patient identified, Emergency Drugs available, Suction available, Timeout performed and Patient being monitored Patient Re-evaluated:Patient Re-evaluated prior to inductionOxygen Delivery Method: Non-rebreather mask

## 2016-11-02 ENCOUNTER — Encounter (HOSPITAL_COMMUNITY): Payer: Self-pay | Admitting: Gastroenterology

## 2016-11-03 ENCOUNTER — Telehealth: Payer: Self-pay | Admitting: Gastroenterology

## 2016-11-03 ENCOUNTER — Other Ambulatory Visit: Payer: Self-pay

## 2016-11-03 DIAGNOSIS — R1310 Dysphagia, unspecified: Secondary | ICD-10-CM

## 2016-11-03 NOTE — Telephone Encounter (Signed)
Dr [facility] called to say that they received everything on the patient, but they needed a signed order from [facility]. Please fax to [phone]

## 2016-11-03 NOTE — Telephone Encounter (Signed)
I faxed the signed form

## 2016-11-10 ENCOUNTER — Telehealth: Payer: Self-pay

## 2016-11-10 NOTE — Telephone Encounter (Signed)
Called [facility] to f/u on referral for esophageal manometry. Pt is scheduled for 11/19/16 at 11:45am.

## 2016-11-19 DIAGNOSIS — R1310 Dysphagia, unspecified: Secondary | ICD-10-CM | POA: Diagnosis not present

## 2016-11-19 DIAGNOSIS — K219 Gastro-esophageal reflux disease without esophagitis: Secondary | ICD-10-CM | POA: Diagnosis not present

## 2017-01-12 ENCOUNTER — Other Ambulatory Visit: Payer: Self-pay

## 2017-01-12 ENCOUNTER — Encounter: Payer: Self-pay | Admitting: Gastroenterology

## 2017-01-12 ENCOUNTER — Ambulatory Visit (INDEPENDENT_AMBULATORY_CARE_PROVIDER_SITE_OTHER): Payer: BLUE CROSS/BLUE SHIELD | Admitting: Gastroenterology

## 2017-01-12 VITALS — BP 103/62 | HR 88 | Temp 97.5°F | Ht 65.0 in | Wt 209.8 lb

## 2017-01-12 DIAGNOSIS — K625 Hemorrhage of anus and rectum: Secondary | ICD-10-CM | POA: Diagnosis not present

## 2017-01-12 DIAGNOSIS — R1310 Dysphagia, unspecified: Secondary | ICD-10-CM | POA: Diagnosis not present

## 2017-01-12 DIAGNOSIS — K582 Mixed irritable bowel syndrome: Secondary | ICD-10-CM

## 2017-01-12 DIAGNOSIS — K589 Irritable bowel syndrome without diarrhea: Secondary | ICD-10-CM | POA: Insufficient documentation

## 2017-01-12 MED ORDER — LIDOCAINE VISCOUS 2 % MT SOLN: 15.0000 mL | Freq: Four times a day (QID) | OROMUCOSAL | 1 refills | Status: AC | PRN

## 2017-01-12 NOTE — Assessment & Plan Note (Addendum)
Moderate internal and large external hemorrhoids. Still with intermittent rectal bleeding. Will refer to general surgery for hemorrhoidectomy as previously recommended.

## 2017-01-12 NOTE — Assessment & Plan Note (Signed)
Alternating constipation and diarrhea but appears more consistent with constipation predominant. Previously did not tolerate Amitiza and Linzess. Trial Trulance. May need to just revert back to OTC Miralax once to every other day.

## 2017-01-12 NOTE — Patient Instructions (Signed)
Continue Prilosec once each morning, 30 minutes before breakfast. I sent in lidocaine solution to take by mouth every 6 hours as needed for esophageal/stomach discomfort.   [facility] and difficulty swallowing.  I will find out who to refer to for hemorrhoids.  We can try Trulance once each day to see if this helps with constipation. It could cause loose stool. If so, then you can try Miralax every day to every other day s needed.   We will see you in 6 months!

## 2017-01-12 NOTE — Assessment & Plan Note (Addendum)
Thorough evaluation thus far with EGD, BPE, [facility]. Notes dysphagia/odynophagia with breads, meats, and pills. Intermittent epigastric discomfort as well. Continue Prilosec once daily, as she has overall better control of typical GERD symptoms with this. [facility] to persistent dysphagia/odynophagia. Add viscous lidocain prn. . Return in 6 months.

## 2017-01-12 NOTE — Progress Notes (Addendum)
REVIEWED. REFER TO DR. Lovell Sheehan OR [facility] @ TO PT PREFERENCE.       Referring Provider: Glori Bickers, MD Primary Care Physician:  Glori Bickers, MD Primary GI: Dr. Darrick Penna   Chief Complaint  Patient presents with  . Gastroesophageal Reflux  . Rectal Bleeding    none in few weeks, occurs off and on  . Constipation  . Diarrhea  . Abdominal Pain    chest and mid lower abd    HPI:   Jeanette Cummings is a 46 y.o. female presenting today with a history of chronic GERD, most recent EGD with normal esophagus, no evidence of EOE. Colonoscopy also completed with benign rectal polyp. Colonoscopy in 10 years.  Tried/failed multiple other PPIs. BPE  with normal esophageal distention and motility, no esophageal mass or stricture, normal. Has seen ENT and prescribed ranitidine in evenings, told she had moderate laryngeal edema. Has been seen at [facility] due to history of autoimmune issues. Recently completed manometry at [facility], which was normal.   Historically, Linzess too harsh, Amitiza 8 mcg with bloating. Has alternating constipation/diarrhea. Fiber causes bloating. Feels like diarrhea is more predominant but goes several days without BM then has diarrhea.   Pain when eating meat, bread, and pills. Odynophagia. Sometimes food/drink causes discomfort in stomach and epigastric region.  Iintermittent reflux is better than it was. Prilosec once a day, which controls symptoms overall. Prilosec BID needed a PA. Zantac every night. No further nocturnal reflux.   Would like to pursue hemorrhoidectomy.   Past Medical History:  Diagnosis Date  . 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 Frequent headaches   . GERD (gastroesophageal reflux disease)   . History of cervical biopsy    multiple  . Hyperthyroidism   . 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 Multiple thyroid nodules   . Neuropathy   . Sjogren's disease (HCC)   . 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  Vasculitis [facility])     Past Surgical History:  Procedure Laterality Date  . BIOPSY  10/27/2016   Procedure: BIOPSY;  Surgeon: West Bali, MD;  Location: [facility];  Service: Endoscopy;;  colon  . CERVICAL CONE BIOPSY     "several".  . COLONOSCOPY N/A 09/01/2013   Dr. Darrick Penna- normal mucosa in the terminal ileum. the colon is redundant, small internal hemorrhoids.  . COLONOSCOPY WITH PROPOFOL N/A 10/27/2016   Dr. Darrick Penna: normal TI. moderate IH, large external hemorrhoids. Benign rectal polyp. COlonoscopy 10 years   . ESOPHAGEAL MANOMETRY  2018   Performed at [facility]. Normal  . ESOPHAGOGASTRODUODENOSCOPY N/A 09/01/2013   Dr.Fields- esophagus appeared normal, small hiatal hernia, moderate non-erosive gastritis found in gastric antrum. duodenum= no abnormalities bx= minimal chronic infammation.  . ESOPHAGOGASTRODUODENOSCOPY (EGD) WITH PROPOFOL N/A 10/27/2016   Dr. Darrick Penna: normal esophagus s/p biopsy with reflux changes. Normal stomach, duodenum.   Marland Kitchen FOOT SURGERY Bilateral    bunionectomy  . history of thyroid radiation  2008  . parathyroidectomy  2014  . POLYPECTOMY  10/27/2016   Procedure: POLYPECTOMY;  Surgeon: West Bali, MD;  Location: [facility];  Service: Endoscopy;;  rectum  . SAVORY DILATION N/A 10/27/2016   Procedure: SAVORY DILATION;  Surgeon: West Bali, MD;  Location: [facility];  Service: Endoscopy;  Laterality: N/A;  . THYMECTOMY  03/09/05  . WISDOM TOOTH EXTRACTION      Current Outpatient Prescriptions  Medication Sig Dispense Refill  .  acetaminophen (TYLENOL) 500 MG tablet Take 1,000 mg by mouth 2 (two) times daily.     Allen Kell Lipoic Acid 200 MG CAPS Take 200 mg by mouth daily.    . Ascorbic Acid (VITAMIN C) 1000 MG tablet Take 1,000 mg by mouth daily.    . Biotin 78295 MCG TBDP Take 10,000 mcg by mouth daily.     . calcium-vitamin D (OSCAL WITH D) 250-125 MG-UNIT per tablet Take 1 tablet by mouth daily.     . Cholecalciferol (VITAMIN D3) 5000 units CAPS Take  5,000 Units by mouth daily.    . cycloSPORINE (RESTASIS) 0.05 % ophthalmic emulsion Place 1 drop into both eyes 2 (two) times daily.     Marland Kitchen docusate sodium (COLACE) 100 MG capsule Take 100 mg by mouth 2 (two) times daily as needed.     . fluorometholone (FML) 0.1 % ophthalmic suspension Apply 1 drop to eye 2 (two) times daily as needed (IRRITATION).     . Gabapentin, Once-Daily, (GRALISE) 600 MG TABS Take 2,400 mg by mouth every evening.     . hydroxychloroquine (PLAQUENIL) 200 MG tablet Take 400 mg by mouth daily.  0  . ibuprofen (ADVIL,MOTRIN) 200 MG tablet Take 400 mg by mouth 2 (two) times daily as needed.    . MedroxyPROGESTERone Acetate 150 MG/ML SUSY Inject 150 mg into the muscle every 3 (three) months.  3  . omeprazole (PRILOSEC) 20 MG capsule Take 1 capsule (20 mg total) by mouth 2 (two) times daily before a meal. (Patient taking differently: Take 20 mg by mouth daily. ) 60 capsule 11  . pentoxifylline (TRENTAL) 400 MG CR tablet Take 400 mg by mouth at bedtime.     Bertram Gala Glycol-Propyl Glycol (SYSTANE OP) Place 2 drops into both eyes every 4 (four) hours as needed (dry eyes).     . ranitidine (ZANTAC) 150 MG tablet Take 150 mg by mouth at bedtime.     Marland Kitchen tiZANidine (ZANAFLEX) 4 MG tablet Take 4 mg by mouth at bedtime as needed for muscle spasms.    . Topiramate ER (TROKENDI XR) 200 MG CP24 Take 400 mg by mouth at bedtime.     . TRULICITY 0.75 MG/0.5ML SOPN INJECT 1 PEN SUBCUTANEOULY ONCE A WEEK ON SUNDAY  2  . verapamil (VERELAN PM) 240 MG 24 hr capsule Take 240 mg by mouth daily.     Marland Kitchen lidocaine (XYLOCAINE) 2 % solution Use as directed 15 mLs in the mouth or throat every 6 (six) hours as needed for mouth pain. 200 mL 1   No current facility-administered medications for this visit.     Allergies as of 01/12/2017 - Review Complete 01/12/2017  Allergen Reaction Noted  . Sumatriptan Other (See Comments) 01/11/2014    Family History  Problem Relation Age of Onset  . Colon polyps  Mother        numerous  . Breast cancer Mother        age 38s  . Cancer Mother        died from metastatic brain cancer, not from breast cancer, unknown primary  . Colon cancer Maternal Aunt        diagnosed at age 68, deceased  . Colon cancer Cousin        diagnosed at age 2, deceased age 69  . Colon polyps Cousin        precancerous, age 23s  . Breast cancer Maternal Aunt        age 76, at  age 46 new primry lung cancer deceased. Sjogrens', polymyositis, hyperthyroid, scleroderma  . Breast cancer Maternal Aunt        late 2160s  . Ovarian cancer Paternal Grandmother   . Ovarian cancer Cousin        age 11042, paternal  . Lung cancer Maternal Grandmother        died age 46  . Other Son        IgA/IgE deficient    Social History   Social History  . Marital status: Married    Spouse name: N/A  . Number of children: 2  . Years of education: N/A   Occupational History  .      Social History Main Topics  . Smoking status: Never Smoker  . Smokeless tobacco: Never Used  . Alcohol use No  . Drug use: No  . Sexual activity: Yes    Birth control/ protection: Injection   Other Topics Concern  . None   Social History Narrative  . None    Review of Systems: As mentioned in HPI   Physical Exam: BP 103/62   Pulse 88   Temp 97.5 F (36.4 C) (Oral)   Ht 5\' 5"  (1.651 m)   Wt 209 lb 12.8 oz (95.2 kg)   LMP 11/12/2016 (Approximate)   BMI 34.91 kg/m  General:   Alert and oriented. No distress noted. Pleasant and cooperative.  Head:  Normocephalic and atraumatic. Eyes:  Conjuctiva clear without scleral icterus. Abdomen:  +BS, soft, non-tender and non-distended. No rebound or guarding. No HSM or masses noted. Msk:  Symmetrical without gross deformities. Normal posture. Extremities:  Without edema. Neurologic:  Alert and  oriented x4;  grossly normal neurologically. Psych:  Alert and cooperative. Normal mood and affect.

## 2017-01-13 NOTE — Progress Notes (Signed)
CC'ED TO PCP 

## 2017-01-19 ENCOUNTER — Encounter: Payer: Self-pay | Admitting: Gastroenterology

## 2017-01-19 ENCOUNTER — Other Ambulatory Visit: Payer: Self-pay

## 2017-01-19 ENCOUNTER — Telehealth: Payer: Self-pay | Admitting: Gastroenterology

## 2017-01-19 DIAGNOSIS — K649 Unspecified hemorrhoids: Secondary | ICD-10-CM

## 2017-01-19 NOTE — Telephone Encounter (Signed)
Please find out from patient regarding where she would prefer to go for hemorrhoidectomy. We recommend Dr. Lovell SheehanJenkins or [facility], whatever her preference may be. I sent a message in MyCHart, but I just want to make sure she get this.

## 2017-01-19 NOTE — Telephone Encounter (Signed)
Pt would like to be referred to Dr. Lovell SheehanJenkins.

## 2017-01-19 NOTE — Telephone Encounter (Signed)
Referral sent to Dr. Jenkins via Epic. °

## 2017-02-09 ENCOUNTER — Encounter: Payer: Self-pay | Admitting: General Surgery

## 2017-02-09 ENCOUNTER — Ambulatory Visit (INDEPENDENT_AMBULATORY_CARE_PROVIDER_SITE_OTHER): Payer: BLUE CROSS/BLUE SHIELD | Admitting: General Surgery

## 2017-02-09 VITALS — BP 109/75 | HR 84 | Temp 98.7°F | Resp 18 | Ht 65.0 in | Wt 212.0 lb

## 2017-02-09 DIAGNOSIS — K640 First degree hemorrhoids: Secondary | ICD-10-CM | POA: Diagnosis not present

## 2017-02-09 NOTE — Patient Instructions (Signed)
Hemorrhoids Hemorrhoids are swollen veins in and around the rectum or anus. There are two types of hemorrhoids:  Internal hemorrhoids. These occur in the veins that are just inside the rectum. They may poke through to the outside and become irritated and painful.  External hemorrhoids. These occur in the veins that are outside of the anus and can be felt as a painful swelling or hard lump near the anus.  Most hemorrhoids do not cause serious problems, and they can be managed with home treatments such as diet and lifestyle changes. If home treatments do not help your symptoms, procedures can be done to shrink or remove the hemorrhoids. What are the causes? This condition is caused by increased pressure in the anal area. This pressure may result from various things, including:  Constipation.  Straining to have a bowel movement.  Diarrhea.  Pregnancy.  Obesity.  Sitting for long periods of time.  Heavy lifting or other activity that causes you to strain.  Anal sex.  What are the signs or symptoms? Symptoms of this condition include:  Pain.  Anal itching or irritation.  Rectal bleeding.  Leakage of stool (feces).  Anal swelling.  One or more lumps around the anus.  How is this diagnosed? This condition can often be diagnosed through a visual exam. Other exams or tests may also be done, such as:  Examination of the rectal area with a gloved hand (digital rectal exam).  Examination of the anal canal using a small tube (anoscope).  A blood test, if you have lost a significant amount of blood.  A test to look inside the colon (sigmoidoscopy or colonoscopy).  How is this treated? This condition can usually be treated at home. However, various procedures may be done if dietary changes, lifestyle changes, and other home treatments do not help your symptoms. These procedures can help make the hemorrhoids smaller or remove them completely. Some of these procedures involve  surgery, and others do not. Common procedures include:  Rubber band ligation. Rubber bands are placed at the base of the hemorrhoids to cut off the blood supply to them.  Sclerotherapy. Medicine is injected into the hemorrhoids to shrink them.  Infrared coagulation. A type of light energy is used to get rid of the hemorrhoids.  Hemorrhoidectomy surgery. The hemorrhoids are surgically removed, and the veins that supply them are tied off.  Stapled hemorrhoidopexy surgery. A circular stapling device is used to remove the hemorrhoids and use staples to cut off the blood supply to them.  Follow these instructions at home: Eating and drinking  Eat foods that have a lot of fiber in them, such as whole grains, beans, nuts, fruits, and vegetables. Ask your health care provider about taking products that have added fiber (fiber supplements).  Drink enough fluid to keep your urine clear or pale yellow. Managing pain and swelling  Take warm sitz baths for 20 minutes, 3-4 times a day to ease pain and discomfort.  If directed, apply ice to the affected area. Using ice packs between sitz baths may be helpful. ? Put ice in a plastic bag. ? Place a towel between your skin and the bag. ? Leave the ice on for 20 minutes, 2-3 times a day. General instructions  Take over-the-counter and prescription medicines only as told by your health care provider.  Use medicated creams or suppositories as told.  Exercise regularly.  Go to the bathroom when you have the urge to have a bowel movement. Do not wait.    Avoid straining to have bowel movements.  Keep the anal area dry and clean. Use wet toilet paper or moist towelettes after a bowel movement.  Do not sit on the toilet for long periods of time. This increases blood pooling and pain. Contact a health care provider if:  You have increasing pain and swelling that are not controlled by treatment or medicine.  You have uncontrolled bleeding.  You  have difficulty having a bowel movement, or you are unable to have a bowel movement.  You have pain or inflammation outside the area of the hemorrhoids. This information is not intended to replace advice given to you by your health care provider. Make sure you discuss any questions you have with your health care provider. Document Released: 07/10/2000 Document Revised: 12/11/2015 Document Reviewed: 03/27/2015 Elsevier Interactive Patient Education  2017 [employer]  

## 2017-02-09 NOTE — Progress Notes (Signed)
Jeanette Cummings; [id number]; [birth year]   HPI Patient is a 46 year old white female who was referred to my care by Dr. Darrick Penna for evaluation and treatment of hemorrhoidal disease. Patient had a history of blood per rectum requiring a colonoscopy in April of this year. She was found to have some rectal polyps and significant hemorrhoidal disease. She states that after the polypectomy, she has had no further rectal bleeding. She has had hemorrhoids for many years after the birth of her first child who is now 48. She currently has no rectal pain. She has had no active bleeding. She is not incontinent. She denies any significant constipation. She does not have to strain to use her bowels. Past Medical History:  Diagnosis Date  . 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 Frequent headaches   . GERD (gastroesophageal reflux disease)   . History of cervical biopsy    multiple  . Hyperthyroidism   . 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 Multiple thyroid nodules   . Neuropathy   . Sjogren's disease (HCC)   . Stroke [facility])    diagnosed with vasculitis- 2017  . Vasculitis [facility])     Past Surgical History:  Procedure Laterality Date  . BIOPSY  10/27/2016   Procedure: BIOPSY;  Surgeon: West Bali, MD;  [facility];  Service: Endoscopy;;  colon  . CERVICAL CONE BIOPSY     "several".  . COLONOSCOPY N/A 09/01/2013   Dr. Darrick Penna- normal mucosa in the terminal ileum. the colon is redundant, small internal hemorrhoids.  . COLONOSCOPY WITH PROPOFOL N/A 10/27/2016   Dr. Darrick Penna: normal TI. moderate IH, large external hemorrhoids. Benign rectal polyp. COlonoscopy 10 years   . ESOPHAGEAL MANOMETRY  2018   Performed at [facility]. Normal  . ESOPHAGOGASTRODUODENOSCOPY N/A 09/01/2013   Dr.Fields- esophagus appeared normal, small hiatal hernia, moderate non-erosive gastritis found in gastric antrum. duodenum= no abnormalities bx= minimal chronic infammation.  . ESOPHAGOGASTRODUODENOSCOPY  (EGD) WITH PROPOFOL N/A 10/27/2016   Dr. Darrick Penna: normal esophagus s/p biopsy with reflux changes. Normal stomach, duodenum.   Marland Kitchen FOOT SURGERY Bilateral    bunionectomy  . history of thyroid radiation  2008  . parathyroidectomy  2014  . POLYPECTOMY  10/27/2016   Procedure: POLYPECTOMY;  Surgeon: West Bali, MD;  [facility];  Service: Endoscopy;;  rectum  . SAVORY DILATION N/A 10/27/2016   Procedure: SAVORY DILATION;  Surgeon: West Bali, MD;  [facility];  Service: Endoscopy;  Laterality: N/A;  . THYMECTOMY  03/09/05  . WISDOM TOOTH EXTRACTION      Family History  Problem Relation Age of Onset  . Colon polyps Mother        numerous  . Breast cancer Mother        age 3s  . Cancer Mother        died from metastatic brain cancer, not from breast cancer, unknown primary  . Colon cancer Maternal Aunt        diagnosed at age 78, deceased  . Colon cancer Cousin        diagnosed at age 32, deceased age 72  . Colon polyps Cousin        precancerous, age 24s  . Breast cancer Maternal Aunt        age 32, at age 30 new primry lung cancer deceased. Sjogrens', polymyositis, hyperthyroid, scleroderma  . Breast cancer Maternal Aunt  late 6460s  . Ovarian cancer Paternal Grandmother   . Ovarian cancer Cousin        age 46, paternal  . Lung cancer Maternal Grandmother        died age 46  . Other Son        IgA/IgE deficient    Current Outpatient Prescriptions on File Prior to Visit  Medication Sig Dispense Refill  . acetaminophen (TYLENOL) 500 MG tablet Take 1,000 mg by mouth 2 (two) times daily.     Allen Kell. Alpha Lipoic Acid 200 MG CAPS Take 200 mg by mouth daily.    . Ascorbic Acid (VITAMIN C) 1000 MG tablet Take 1,000 mg by mouth daily.    . Biotin 2130810000 MCG TBDP Take 10,000 mcg by mouth daily.     . calcium-vitamin D (OSCAL WITH D) 250-125 MG-UNIT per tablet Take 1 tablet by mouth daily.     . Cholecalciferol (VITAMIN D3) 5000 units CAPS Take 5,000 Units by  mouth daily.    . cycloSPORINE (RESTASIS) 0.05 % ophthalmic emulsion Place 1 drop into both eyes 2 (two) times daily.     Marland Kitchen. docusate sodium (COLACE) 100 MG capsule Take 100 mg by mouth 2 (two) times daily as needed.     . fluorometholone (FML) 0.1 % ophthalmic suspension Apply 1 drop to eye 2 (two) times daily as needed (IRRITATION).     . Gabapentin, Once-Daily, (GRALISE) 600 MG TABS Take 2,400 mg by mouth every evening.     . hydroxychloroquine (PLAQUENIL) 200 MG tablet Take 400 mg by mouth daily.  0  . ibuprofen (ADVIL,MOTRIN) 200 MG tablet Take 400 mg by mouth 2 (two) times daily as needed.    . lidocaine (XYLOCAINE) 2 % solution Use as directed 15 mLs in the mouth or throat every 6 (six) hours as needed for mouth pain. 200 mL 1  . MedroxyPROGESTERone Acetate 150 MG/ML SUSY Inject 150 mg into the muscle every 3 (three) months.  3  . omeprazole (PRILOSEC) 20 MG capsule Take 1 capsule (20 mg total) by mouth 2 (two) times daily before a meal. (Patient taking differently: Take 20 mg by mouth daily. ) 60 capsule 11  . pentoxifylline (TRENTAL) 400 MG CR tablet Take 400 mg by mouth at bedtime.     Bertram Gala. Polyethyl Glycol-Propyl Glycol (SYSTANE OP) Place 2 drops into both eyes every 4 (four) hours as needed (dry eyes).     . ranitidine (ZANTAC) 150 MG tablet Take 150 mg by mouth at bedtime.     Marland Kitchen. tiZANidine (ZANAFLEX) 4 MG tablet Take 4 mg by mouth at bedtime as needed for muscle spasms.    . Topiramate ER (TROKENDI XR) 200 MG CP24 Take 400 mg by mouth at bedtime.     . TRULICITY 0.75 MG/0.5ML SOPN INJECT 1 PEN SUBCUTANEOULY ONCE A WEEK ON SUNDAY  2  . verapamil (VERELAN PM) 240 MG 24 hr capsule Take 240 mg by mouth daily.      No current facility-administered medications on file prior to visit.     Allergies  Allergen Reactions  . Sumatriptan Other (See Comments)    Pain, burning and tingling sensation in jaw, neck and shoulders.    History  Alcohol Use No    History  Smoking Status  . Never  Smoker  Smokeless Tobacco  . Never Used    Review of Systems  Constitutional: Positive for malaise/fatigue.  HENT: Negative.   Eyes: Positive for blurred vision.  Respiratory: Negative.  Cardiovascular: Negative.   Gastrointestinal: Positive for heartburn.  Genitourinary: Negative.   Musculoskeletal: Positive for back pain, joint pain and neck pain.  Skin: Negative.   Neurological: Negative.   Endo/Heme/Allergies: Negative.   Psychiatric/Behavioral: Negative.     Objective   Vitals:   02/09/17 1531  BP: 109/75  Pulse: 84  Resp: 18  Temp: 98.7 F (37.1 C)    Physical Exam  Constitutional: She is oriented to person, place, and time and well-developed, well-nourished, and in no distress.  HENT:  Head: Normocephalic and atraumatic.  Cardiovascular: Normal rate, regular rhythm and normal heart sounds.   No murmur heard. Pulmonary/Chest: Effort normal. She has no wheezes. She has no rales.  Genitourinary:  Genitourinary Comments: Rectal examination reveals multiple external hemorrhoidal skin tags with 2 internal hemorrhoids that are grade 1 in nature. No active bleeding is noted. Normal sphincter tone.  Neurological: She is alert and oriented to person, place, and time.  Skin: Skin is warm and dry.  Vitals reviewed.  colonoscopy report reviewed.  Assessment  Hemorrhoidal disease, currently asymptomatic Plan   Patient did not require surgical intervention at this time. She was given literature about hemorrhoidal disease. She was instructed to return should she develop rectal pain or bleeding. Follow-up as needed.

## 2017-03-19 DIAGNOSIS — Z923 Personal history of irradiation: Secondary | ICD-10-CM | POA: Diagnosis not present

## 2017-03-19 DIAGNOSIS — R1319 Other dysphagia: Secondary | ICD-10-CM | POA: Diagnosis not present

## 2017-03-19 DIAGNOSIS — K228 Other specified diseases of esophagus: Secondary | ICD-10-CM | POA: Diagnosis not present

## 2017-03-19 DIAGNOSIS — K219 Gastro-esophageal reflux disease without esophagitis: Secondary | ICD-10-CM | POA: Diagnosis not present

## 2017-04-02 DIAGNOSIS — R1310 Dysphagia, unspecified: Secondary | ICD-10-CM | POA: Diagnosis not present

## 2017-04-02 DIAGNOSIS — K219 Gastro-esophageal reflux disease without esophagitis: Secondary | ICD-10-CM | POA: Diagnosis not present

## 2017-04-26 HISTORY — PX: ESOPHAGOGASTRODUODENOSCOPY: SHX1529

## 2017-05-19 DIAGNOSIS — K2930 Chronic superficial gastritis without bleeding: Secondary | ICD-10-CM | POA: Diagnosis not present

## 2017-05-19 DIAGNOSIS — R1310 Dysphagia, unspecified: Secondary | ICD-10-CM | POA: Diagnosis not present

## 2017-05-27 DIAGNOSIS — E210 Primary hyperparathyroidism: Secondary | ICD-10-CM | POA: Diagnosis not present

## 2017-05-27 DIAGNOSIS — E892 Postprocedural hypoparathyroidism: Secondary | ICD-10-CM | POA: Diagnosis not present

## 2017-05-27 DIAGNOSIS — R5383 Other fatigue: Secondary | ICD-10-CM | POA: Diagnosis not present

## 2017-06-07 DIAGNOSIS — M3500 Sicca syndrome, unspecified: Secondary | ICD-10-CM | POA: Diagnosis not present

## 2017-06-07 DIAGNOSIS — Z23 Encounter for immunization: Secondary | ICD-10-CM | POA: Diagnosis not present

## 2017-06-07 DIAGNOSIS — Z9889 Other specified postprocedural states: Secondary | ICD-10-CM | POA: Diagnosis not present

## 2017-06-07 DIAGNOSIS — G629 Polyneuropathy, unspecified: Secondary | ICD-10-CM | POA: Diagnosis not present

## 2017-06-07 DIAGNOSIS — M47814 Spondylosis without myelopathy or radiculopathy, thoracic region: Secondary | ICD-10-CM | POA: Diagnosis not present

## 2017-06-07 DIAGNOSIS — R0609 Other forms of dyspnea: Secondary | ICD-10-CM | POA: Diagnosis not present

## 2017-06-29 ENCOUNTER — Ambulatory Visit: Payer: BLUE CROSS/BLUE SHIELD | Admitting: Gastroenterology

## 2017-06-29 ENCOUNTER — Encounter: Payer: Self-pay | Admitting: Gastroenterology

## 2017-06-29 VITALS — BP 115/71 | HR 90 | Temp 98.1°F | Ht 65.0 in | Wt 228.8 lb

## 2017-06-29 DIAGNOSIS — K582 Mixed irritable bowel syndrome: Secondary | ICD-10-CM

## 2017-06-29 DIAGNOSIS — K219 Gastro-esophageal reflux disease without esophagitis: Secondary | ICD-10-CM | POA: Diagnosis not present

## 2017-06-29 NOTE — Progress Notes (Signed)
Referring Provider: Glori Bickersrivedi, Rajendra, MD Primary Care Physician:  Glori Bickersrivedi, Rajendra, MD Primary GI: Dr. Darrick PennaFields   Chief Complaint  Patient presents with  . Irritable Bowel Syndrome  . Dysphagia    rare    HPI:   Jeanette Cummings is a 46 y.o. female presenting today with a history of chronic GERD, most recent EGD with normal esophagus, no evidence of EOE. Colonoscopy also completed with benign rectal polyp. Colonoscopy in 10 years.  Tried/failed multiple other PPIs. BPE  with normal esophageal distention and motility, no esophageal mass or stricture, normal. Has seen ENT and prescribed ranitidine in evenings, told she had moderate laryngeal edema. [facility] due to history of autoimmune issues. [facility], which was normal. [facility]t in June 2018 due to persistent dysphagia/odynophagia. [facility], pathology with mild chronic gastritis, negative EOE.    She reports saying her dysphagia is better. [facility]. Taking Prilosec 40 mg in the morning and Zantac at night until PA is done. Got choked on a sausage ball the other day and it came up her nose.   Historically, Linzess too harsh, Amitiza 8 mcg with bloating. Has alternating constipation/diarrhea. Fiber causes bloating. Feels like she is pretty even with constipation and diarrhea. No issues with further rectal bleeding. Saw Dr. Lovell SheehanJenkins and didn't need hemorrhoid repair at that time.   Past Medical History:  Diagnosis Date  . 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 Frequent headaches   . GERD (gastroesophageal reflux disease)   . History of cervical biopsy    multiple  . Hyperthyroidism   . 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 Multiple thyroid nodules   . Neuropathy   . Sjogren's disease (HCC)   . Stroke [facility](HCC)    diagnosed with vasculitis- 2017  . Vasculitis [facility](HCC)       Past Surgical History:  Procedure Laterality Date  . BIOPSY  10/27/2016   Procedure: BIOPSY;  Surgeon: West BaliSandi L Fields, MD;  [facility];  Service: Endoscopy;;  colon  . CERVICAL CONE BIOPSY     "several".  . COLONOSCOPY N/A 09/01/2013   Dr. Darrick PennaFields- normal mucosa in the terminal ileum. the colon is redundant, small internal hemorrhoids.  . COLONOSCOPY WITH PROPOFOL N/A 10/27/2016   Dr. Darrick PennaFields: normal TI. moderate IH, large external hemorrhoids. Benign rectal polyp. COlonoscopy 10 years   . ESOPHAGEAL MANOMETRY  2018   [facility] Normal  . ESOPHAGOGASTRODUODENOSCOPY N/A 09/01/2013   Dr.Fields- esophagus appeared normal, small hiatal hernia, moderate non-erosive gastritis found in gastric antrum. duodenum= no abnormalities bx= minimal chronic infammation.  . ESOPHAGOGASTRODUODENOSCOPY (EGD) WITH PROPOFOL N/A 10/27/2016   Dr. Darrick PennaFields: normal esophagus s/p biopsy with reflux changes. Normal stomach, duodenum.   Marland Kitchen. FOOT SURGERY Bilateral    bunionectomy  . history of thyroid radiation  2008  . parathyroidectomy  2014  . POLYPECTOMY  10/27/2016   Procedure: POLYPECTOMY;  Surgeon: West BaliSandi L Fields, MD;  [facility];  Service: Endoscopy;;  rectum  . SAVORY DILATION N/A 10/27/2016   Procedure: SAVORY DILATION;  Surgeon: West BaliSandi L Fields, MD;  [facility];  Service: Endoscopy;  Laterality: N/A;  . THYMECTOMY  03/09/05  . WISDOM TOOTH EXTRACTION      Current Outpatient Medications  Medication Sig Dispense Refill  .  acetaminophen (TYLENOL) 500 MG tablet Take 1,000 mg by mouth 2 (two) times daily.     Allen Kell Lipoic Acid 200 MG CAPS Take 200 mg by mouth daily.    . Ascorbic Acid (VITAMIN C) 1000 MG tablet Take 1,000 mg by mouth daily.    . Biotin 16109 MCG TBDP Take 10,000 mcg by mouth daily.     . calcium-vitamin D (OSCAL WITH D) 250-125 MG-UNIT per tablet Take 1 tablet by mouth daily.     . Cholecalciferol (VITAMIN D3) 5000 units CAPS Take 5,000 Units by mouth  daily.    . cycloSPORINE (RESTASIS) 0.05 % ophthalmic emulsion Place 1 drop into both eyes 2 (two) times daily.     Marland Kitchen docusate sodium (COLACE) 100 MG capsule Take 100 mg by mouth 2 (two) times daily as needed.     . ferrous sulfate 325 (65 FE) MG tablet Take 325 mg by mouth 2 (two) times daily.    . fluorometholone (FML) 0.1 % ophthalmic suspension Apply 1 drop to eye 2 (two) times daily as needed (IRRITATION).     . Gabapentin, Once-Daily, (GRALISE) 600 MG TABS Take 1,800 mg by mouth every evening.     . hydroxychloroquine (PLAQUENIL) 200 MG tablet Take 400 mg by mouth daily.  0  . ibuprofen (ADVIL,MOTRIN) 200 MG tablet Take 400 mg by mouth 2 (two) times daily.     Marland Kitchen lidocaine (XYLOCAINE) 2 % solution Use as directed 15 mLs in the mouth or throat every 6 (six) hours as needed for mouth pain. 200 mL 1  . MedroxyPROGESTERone Acetate 150 MG/ML SUSY Inject 150 mg into the muscle every 3 (three) months.  3  . omeprazole (PRILOSEC) 20 MG capsule Take 1 capsule (20 mg total) by mouth 2 (two) times daily before a meal. (Patient taking differently: Take 40 mg by mouth 2 (two) times daily before a meal. ) 60 capsule 11  . pentoxifylline (TRENTAL) 400 MG CR tablet Take 400 mg by mouth at bedtime.     Bertram Gala Glycol-Propyl Glycol (SYSTANE OP) Place 2 drops into both eyes every 4 (four) hours as needed (dry eyes).     Marland Kitchen PRESCRIPTION MEDICATION Botox injection every 12 weeks    . ranitidine (ZANTAC) 150 MG tablet Take 150 mg by mouth at bedtime.     Marland Kitchen tiZANidine (ZANAFLEX) 4 MG tablet Take 4 mg by mouth at bedtime as needed for muscle spasms.    . TRULICITY 0.75 MG/0.5ML SOPN INJECT 1 PEN SUBCUTANEOULY ONCE A WEEK ON SUNDAY  2  . verapamil (VERELAN PM) 240 MG 24 hr capsule Take 240 mg by mouth daily.     Marland Kitchen zonisamide (ZONEGRAN) 25 MG capsule Take 50 mg by mouth at bedtime.    . Topiramate ER (TROKENDI XR) 200 MG CP24 Take 400 mg by mouth at bedtime.      No current facility-administered medications for  this visit.     Allergies as of 06/29/2017 - Review Complete 06/29/2017  Allergen Reaction Noted  . Sumatriptan Other (See Comments) 01/11/2014    Family History  Problem Relation Age of Onset  . Colon polyps Mother        numerous  . Breast cancer Mother        age 78s  . Cancer Mother        died from metastatic brain cancer, not from breast cancer, unknown primary  . Colon cancer Maternal Aunt        diagnosed at age  50, deceased  . Colon cancer Cousin        diagnosed at age 433, deceased age 46  . Colon polyps Cousin        precancerous, age 1030s  . Breast cancer Maternal Aunt        age 46, at age 46 new primry lung cancer deceased. Sjogrens', polymyositis, hyperthyroid, scleroderma  . Breast cancer Maternal Aunt        late 6760s  . Ovarian cancer Paternal Grandmother   . Ovarian cancer Cousin        age 46, paternal  . Lung cancer Maternal Grandmother        died age 46  . Other Son        IgA/IgE deficient    Social History   Socioeconomic History  . Marital status: Married    Spouse name: None  . Number of children: 2  . Years of education: None  . Highest education level: None  Social Needs  . Financial resource strain: None  . Food insecurity - worry: None  . Food insecurity - inability: None  . Transportation needs - medical: None  . Transportation needs - non-medical: None  Occupational History  . Occupation:    Tobacco Use  . Smoking status: Never Smoker  . Smokeless tobacco: Never Used  Substance and Sexual Activity  . Alcohol use: No  . Drug use: No  . Sexual activity: Yes    Birth control/protection: Injection  Other Topics Concern  . None  Social History Narrative  . None    Review of Systems: Gen: Denies fever, chills, anorexia. Denies fatigue, weakness, weight loss.  CV: Denies chest pain, palpitations, syncope, peripheral edema, and claudication. Resp: Denies dyspnea at rest, cough, wheezing, coughing up blood, and pleurisy. GI:  see HPI  Derm: Denies rash, itching, dry skin Psych: Denies depression, anxiety, memory loss, confusion. No homicidal or suicidal ideation.  Heme: Denies bruising, bleeding, and enlarged lymph nodes.  Physical Exam: BP 115/71   Pulse 90   Temp 98.1 F (36.7 C) (Oral)   Ht 5\' 5"  (1.651 m)   Wt 228 lb 12.8 oz (103.8 kg)   BMI 38.07 kg/m  General:   Alert and oriented. No distress noted. Pleasant and cooperative.  Head:  Normocephalic and atraumatic. Eyes:  Conjuctiva clear without scleral icterus. Mouth:  Oral mucosa pink and moist.  Abdomen:  +BS, soft, non-tender and non-distended. No rebound or guarding. No HSM or masses noted. Msk:  Symmetrical without gross deformities. Normal posture. Extremities:  Without edema. Neurologic:  Alert and  oriented x4 Psych:  Alert and cooperative. Normal mood and affect.

## 2017-06-29 NOTE — Patient Instructions (Signed)
I am glad you are doing better from a swallowing standpoint! Please let me know if you need anything.  Try the Miralax 1/2 cap to whole cap daily to every other day. You will have to titrate to see what works best for you.  We will see you back in 1 year or sooner if needed!  Have a wonderful holiday season!

## 2017-07-05 ENCOUNTER — Encounter: Payer: Self-pay | Admitting: Gastroenterology

## 2017-07-05 NOTE — Assessment & Plan Note (Signed)
46 year old female with chronic GERD and dysphagia, [facility] Recently [facility]. Dysphagia resolved after dilation. At baseline. Will return in 1 year or sooner if needed.

## 2017-07-05 NOTE — Assessment & Plan Note (Signed)
Alternating constipation and diarrhea but seems to be more constipation predominant. Has not done well with Amitiza , Linzess, or Trulance historically. Will titrate Miralax daily to every other day. Return in 1 year. Next colonoscopy in 2028.

## 2017-07-07 NOTE — Progress Notes (Signed)
CC'D TO PCP °

## 2017-12-06 DIAGNOSIS — S86819A Strain of other muscle(s) and tendon(s) at lower leg level, unspecified leg, initial encounter: Secondary | ICD-10-CM | POA: Diagnosis not present

## 2017-12-06 DIAGNOSIS — M3500 Sicca syndrome, unspecified: Secondary | ICD-10-CM | POA: Diagnosis not present

## 2017-12-06 DIAGNOSIS — G629 Polyneuropathy, unspecified: Secondary | ICD-10-CM | POA: Diagnosis not present

## 2017-12-06 DIAGNOSIS — R0609 Other forms of dyspnea: Secondary | ICD-10-CM | POA: Diagnosis not present

## 2017-12-06 DIAGNOSIS — X58XXXA Exposure to other specified factors, initial encounter: Secondary | ICD-10-CM | POA: Diagnosis not present

## 2018-03-15 ENCOUNTER — Telehealth: Payer: Self-pay

## 2018-03-15 NOTE — Telephone Encounter (Signed)
Pt would like to schedule an appointment for heartburn and stomach pains. Please schedule and add on cancellation list. Pt needs earlier appointments due to having to pick her [place] from school [place]. [phone]

## 2018-03-15 NOTE — Telephone Encounter (Signed)
PATIENT SCHEDULED AND ADDED TO A CANCELLATION LIST

## 2018-05-19 ENCOUNTER — Encounter: Payer: Self-pay | Admitting: "Endocrinology

## 2018-06-13 DIAGNOSIS — G5603 Carpal tunnel syndrome, bilateral upper limbs: Secondary | ICD-10-CM | POA: Diagnosis not present

## 2018-06-13 DIAGNOSIS — M3500 Sicca syndrome, unspecified: Secondary | ICD-10-CM | POA: Diagnosis not present

## 2018-06-13 DIAGNOSIS — G629 Polyneuropathy, unspecified: Secondary | ICD-10-CM | POA: Diagnosis not present

## 2018-06-29 ENCOUNTER — Telehealth: Payer: Self-pay | Admitting: Gastroenterology

## 2018-06-29 NOTE — Telephone Encounter (Signed)
I called and spoke with a patient and she stated that she wrote the date down for today.  I made her aware that we have automatic reminder calls that go out and messages was sent to her in My Chart.  She stated she doesn't look at My Chart and she didn't receive a reminder call and she is going to find another GI doctor.  She thanked me for calling and the call was disconnected.

## 2018-06-29 NOTE — Telephone Encounter (Signed)
PATIENT CAME IN A DAY EARLY FOR HER APPOINTMENT AND SAID SHE PUT IT WAS TODAY, I OFFERED TO RESCHEDULE HER AND SHE SAID "YOU KNOW WHAT, DON'T WORRY ABOUT IT, i'LL SWITCH DOCTORS" AND LEFT

## 2018-06-30 ENCOUNTER — Ambulatory Visit: Payer: BLUE CROSS/BLUE SHIELD | Admitting: Gastroenterology

## 2018-06-30 ENCOUNTER — Encounter

## 2018-08-10 ENCOUNTER — Encounter: Payer: Self-pay | Admitting: Gastroenterology

## 2018-12-12 DIAGNOSIS — M3500 Sicca syndrome, unspecified: Secondary | ICD-10-CM | POA: Diagnosis not present

## 2019-02-21 ENCOUNTER — Encounter: Payer: Self-pay | Admitting: "Endocrinology

## 2019-02-21 ENCOUNTER — Ambulatory Visit (INDEPENDENT_AMBULATORY_CARE_PROVIDER_SITE_OTHER): Payer: BC Managed Care – PPO | Admitting: "Endocrinology

## 2019-02-21 ENCOUNTER — Other Ambulatory Visit: Payer: Self-pay

## 2019-02-21 VITALS — BP 117/81 | HR 93 | Ht 65.0 in | Wt 229.0 lb

## 2019-02-21 DIAGNOSIS — E349 Endocrine disorder, unspecified: Secondary | ICD-10-CM

## 2019-02-21 DIAGNOSIS — E0590 Thyrotoxicosis, unspecified without thyrotoxic crisis or storm: Secondary | ICD-10-CM

## 2019-02-21 DIAGNOSIS — R739 Hyperglycemia, unspecified: Secondary | ICD-10-CM

## 2019-02-21 NOTE — Progress Notes (Signed)
Endocrinology Consult Note                                            02/21/2019, 7:09 PM   Subjective:    Patient ID: Jeanette Cummings, female    DOB: May 03, 1971, PCP Keane Police, MD   Past Medical History:  Diagnosis Date  . Autoimmune disease ([place])    large family history  . Frequent headaches   . GERD (gastroesophageal reflux disease)   . History of cervical biopsy    multiple  . Hyperthyroidism   . 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 Multiple thyroid nodules   . Neuropathy   . Sjogren's disease ([place])   . Stroke [facility])    diagnosed with vasculitis- 2017  . Vasculitis [facility])    Past Surgical History:  Procedure Laterality Date  . BIOPSY  10/27/2016   Procedure: BIOPSY;  Surgeon: Danie Binder, MD;  Location: [facility];  Service: Endoscopy;;  colon  . CERVICAL CONE BIOPSY     "several".  . COLONOSCOPY N/A 09/01/2013   Dr. Oneida Alar- normal mucosa in the terminal ileum. the colon is redundant, small internal hemorrhoids.  . COLONOSCOPY WITH PROPOFOL N/A 10/27/2016   Dr. Oneida Alar: normal TI. moderate IH, large external hemorrhoids. Benign rectal polyp. COlonoscopy 10 years   . ESOPHAGEAL MANOMETRY  2018   Performed at [facility]. Normal  . ESOPHAGOGASTRODUODENOSCOPY N/A 09/01/2013   Dr.Fields- esophagus appeared normal, small hiatal hernia, moderate non-erosive gastritis found in gastric antrum. duodenum= no abnormalities bx= minimal chronic infammation.  . ESOPHAGOGASTRODUODENOSCOPY  04/2017   [facility]: empiric dilation, mild chronic gastritis, negative EOE  . ESOPHAGOGASTRODUODENOSCOPY (EGD) WITH PROPOFOL N/A 10/27/2016   Dr. Oneida Alar: normal esophagus s/p biopsy with reflux changes. Normal stomach, duodenum.   Marland Kitchen FOOT SURGERY Bilateral    bunionectomy  . history of thyroid radiation  2008  . parathyroidectomy  2014  . POLYPECTOMY  10/27/2016   Procedure: POLYPECTOMY;  Surgeon: Danie Binder, MD;  Location[facility];  Service: Endoscopy;;  rectum  . SAVORY  DILATION N/A 10/27/2016   Procedure: SAVORY DILATION;  Surgeon: Danie Binder, MD;  Location: [facility];  Service: Endoscopy;  Laterality: N/A;  . THYMECTOMY  03/09/05  . WISDOM TOOTH EXTRACTION     Social History   Socioeconomic History  . Marital status: Married    Spouse name: Not on file  . Number of children: 2  . Years of education: Not on file  . Highest education level: Not on file  Occupational History  . Occupation:    Social Needs  . Financial resource strain: Not on file  . Food insecurity    Worry: Not on file    Inability: Not on file  . Transportation needs    Medical: Not on file    Non-medical: Not on file  Tobacco Use  . Smoking status: Never Smoker  . Smokeless tobacco: Never Used  Substance and Sexual Activity  . Alcohol use: No  . Drug use: No  . Sexual activity: Yes    Birth control/protection: Injection  Lifestyle  . Physical activity    Days per week: Not on file    Minutes per session: Not on file  . Stress: Not on file  Relationships  . Social Herbalist on phone:  Not on file    Gets together: Not on file    Attends religious service: Not on file    Active member of club or organization: Not on file    Attends meetings of clubs or organizations: Not on file    Relationship status: Not on file  Other Topics Concern  . Not on file  Social History Narrative  . Not on file   Family History  Problem Relation Age of Onset  . Colon polyps Mother        numerous  . Breast cancer Mother        age 6430s  . Cancer Mother        died from metastatic brain cancer, not from breast cancer, unknown primary  . Colon cancer Maternal Aunt        diagnosed at age 48, deceased  . Colon cancer Cousin        diagnosed at age 48, deceased age 48  . Colon polyps Cousin        precancerous, age 4330s  . Breast cancer Maternal Aunt        age 48, at age 48 new primry lung cancer deceased. Sjogrens', polymyositis, hyperthyroid, scleroderma  .  Breast cancer Maternal Aunt        late 5860s  . Ovarian cancer Paternal Grandmother   . Ovarian cancer Cousin        age 48, paternal  . Lung cancer Maternal Grandmother        died age 48  . Other Son        IgA/IgE deficient   Outpatient Encounter Medications as of 02/21/2019  Medication Sig  . acetaminophen (TYLENOL) 500 MG tablet Take 1,000 mg by mouth 2 (two) times daily.   . Calcium-Magnesium-Zinc 333-133-5 MG TABS Take by mouth 2 (two) times a day.  . cycloSPORINE (RESTASIS) 0.05 % ophthalmic emulsion Place 1 drop into both eyes 2 (two) times daily.   . DULoxetine (CYMBALTA) 30 MG capsule daily.  . famotidine (PEPCID) 40 MG tablet daily.  . fluorometholone (FML) 0.1 % ophthalmic suspension Apply 1 drop to eye 2 (two) times daily as needed (IRRITATION).   . Gabapentin, Once-Daily, (GRALISE) 600 MG TABS Take 1,800 mg by mouth every evening.   . hydroxychloroquine (PLAQUENIL) 200 MG tablet Take 400 mg by mouth daily.  Marland Kitchen. ibuprofen (ADVIL,MOTRIN) 200 MG tablet Take 400 mg by mouth 2 (two) times daily.   Marland Kitchen. lidocaine (XYLOCAINE) 2 % solution Use as directed 15 mLs in the mouth or throat every 6 (six) hours as needed for mouth pain.  . MedroxyPROGESTERone Acetate 150 MG/ML SUSY Inject 150 mg into the muscle every 3 (three) months.  Marland Kitchen. omeprazole (PRILOSEC) 20 MG capsule Take 1 capsule (20 mg total) by mouth 2 (two) times daily before a meal. (Patient taking differently: Take 40 mg by mouth 2 (two) times daily before a meal. )  . OnabotulinumtoxinA (BOTOX IJ) Every 12 weeks  . Polyethyl Glycol-Propyl Glycol (SYSTANE OP) Place 2 drops into both eyes every 4 (four) hours as needed (dry eyes).   Marland Kitchen. PRESCRIPTION MEDICATION Botox injection every 12 weeks  . torsemide (DEMADEX) 20 MG tablet daily.  . [DISCONTINUED] Alpha Lipoic Acid 200 MG CAPS Take 200 mg by mouth daily.  . [DISCONTINUED] Ascorbic Acid (VITAMIN C) 1000 MG tablet Take 1,000 mg by mouth daily.  . [DISCONTINUED] Biotin 9147810000 MCG TBDP  Take 10,000 mcg by mouth daily.   . [DISCONTINUED] calcium-vitamin D (OSCAL WITH  D) 250-125 MG-UNIT per tablet Take 1 tablet by mouth daily.   . [DISCONTINUED] Cholecalciferol (VITAMIN D3) 5000 units CAPS Take 5,000 Units by mouth daily.  . [DISCONTINUED] docusate sodium (COLACE) 100 MG capsule Take 100 mg by mouth 2 (two) times daily as needed.   . [DISCONTINUED] ferrous sulfate 325 (65 FE) MG tablet Take 325 mg by mouth 2 (two) times daily.  . [DISCONTINUED] pentoxifylline (TRENTAL) 400 MG CR tablet Take 400 mg by mouth at bedtime.   . [DISCONTINUED] ranitidine (ZANTAC) 150 MG tablet Take 150 mg by mouth at bedtime.   . [DISCONTINUED] tiZANidine (ZANAFLEX) 4 MG tablet Take 4 mg by mouth at bedtime as needed for muscle spasms.  . [DISCONTINUED] Topiramate ER (TROKENDI XR) 200 MG CP24 Take 400 mg by mouth at bedtime.   . [DISCONTINUED] TRULICITY 0.75 MG/0.5ML SOPN INJECT 1 PEN SUBCUTANEOULY ONCE A WEEK ON SUNDAY  . [DISCONTINUED] verapamil (VERELAN PM) 240 MG 24 hr capsule Take 240 mg by mouth daily.   . [DISCONTINUED] zonisamide (ZONEGRAN) 25 MG capsule Take 50 mg by mouth at bedtime.   No facility-administered encounter medications on file as of 02/21/2019.    ALLERGIES: Allergies  Allergen Reactions  . Sumatriptan Other (See Comments)    Pain, burning and tingling sensation in jaw, neck and shoulders.    VACCINATION STATUS:  There is no immunization history on file for this patient.  HPI Jeanette Cummings is 48 y.o. female who presents today with a medical history as above. she is being seen in consultation for history of multiple endocrine disorders requested by Glori Bickersrivedi, Rajendra, MD.  Her history includes primary hyperparathyroidism which [facility] Neita GoodnightSosa in August 2014, with subsequent normalization of PTH and calcium levels.  She also gives history of hypothyroidism which required treatment with I-131 treatment in 2008 with  partial response which required methimazole intervention for 18 months completed prior to 2010.   -She underwent genetic testing for MEN-1 in 2014 in [facility] which was negative. She does not have recent labs for parathyroid, thyroid, pituitary, functional assessment.   -She has multiple complaints including fatigue, and progressive weight gain.   -She is not currently on thyroid hormone supplements. -Denies history of nephrolithiasis, osteoporosis.  She denies palpitations, tremors, heat intolerance.  She denies any history of hypoglycemia, active skin rash.  Reportedly, she is being worked up for this issue of Sjogren's disease by her rheumatologist.  She is currently on Plaquenil 400 mg daily, as well as anti-depression prescriptions.  -She has GERD for which she underwent multiple endoscopies, currently on omeprazole 20 g p.o. daily.    -She has multiple family members with various thyroid dysfunctions including an aunt, her son, uncle, and cousin.  She denies family history of thyroid malignancy. -She did not have recent thyroid ultrasound.  During her last thyroid ultrasound in October 2017, it was noted that right lobe measured 5.2 cm with no discrete nodules, left lobe measured 5.1 cm with no discrete nodules, isthmus measured 0.5 cm with a stable 0.7 cm nodule.  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  She denies any history of myasthenia gravis.    Review of Systems  Constitutional: + progressive weight gain, +fatigue, no subjective hyperthermia, no subjective hypothermia Eyes: no blurry vision, no xerophthalmia ENT: no sore throat, no nodules palpated in throat, no dysphagia/odynophagia, no hoarseness Cardiovascular: no Chest Pain, no  Shortness of Breath, no palpitations, no leg swelling Respiratory: no cough, no shortness of breath Gastrointestinal: no Nausea/Vomiting/Diarhhea Musculoskeletal: no muscle/joint  aches Skin: no rashes, + dry skin Neurological: no tremors, no numbness, no tingling, no dizziness Psychiatric: no depression, no anxiety  Objective:    BP 117/81   Pulse 93   Ht 5\' 5"  (1.651 m)   Wt 229 lb (103.9 kg)   BMI 38.11 kg/m   Wt Readings from Last 3 Encounters:  02/21/19 229 lb (103.9 kg)  06/29/17 228 lb 12.8 oz (103.8 kg)  02/09/17 212 lb (96.2 kg)    Physical Exam  Constitutional: + Obese with a BMI of 38, not in acute distress, normal state of mind Eyes: PERRLA, EOMI, no exophthalmos ENT: moist mucous membranes, no gross thyromegaly, no gross cervical lymphadenopathy Cardiovascular: normal precordial activity, Regular Rate and Rhythm, no Murmur/Rubs/Gallops Respiratory:  adequate breathing efforts, no gross chest deformity, Clear to auscultation bilaterally, + old well-healed sternotomy scar. Gastrointestinal: abdomen soft, Non -tender, No distension, Bowel Sounds present, no gross organomegaly Musculoskeletal: no gross deformities, strength intact in all four extremities Skin: moist, warm, no rashes Neurological: no tremor with outstretched hands, Deep tendon reflexes normal in bilateral lower extremities.   CMP     Component Value Date/Time   NA 136 10/21/2016 1251   NA 138 08/11/2013   K 3.7 10/21/2016 1251   K 4.2 08/11/2013   CL 111 10/21/2016 1251   CO2 21 (L) 10/21/2016 1251   GLUCOSE 69 10/21/2016 1251   BUN 21 (H) 10/21/2016 1251   BUN 14 08/11/2013   CREATININE 0.81 10/21/2016 1251   CREATININE 0.81 08/11/2013   CALCIUM 8.8 (L) 10/21/2016 1251   CALCIUM 9.8 08/11/2013   PROT 6.0 07/22/2009 0634   ALBUMIN 4.2 08/11/2013   AST 16 08/11/2013   ALT 20 08/11/2013   ALKPHOS 82 08/11/2013   BILITOT 0.3 08/11/2013   GFRNONAA >60 10/21/2016 1251   GFRAA >60 10/21/2016 1251    Lab Results  Component Value Date   HGBA1C 5.4 08/11/2013       Assessment & Plan:   1. Endocrine disorder, unspecified 2.  History of hyperthyroidism status  post RAI  - Jeanette Cummings  is being seen at a kind request of Glori Bickersrivedi, Rajendra, MD. - I have reviewed her available endocrine records and clinically evaluated the patient. - Based on these reviews, she has history of hyperthyroidism status post treatment with radioactive iodine and methimazole with reported biochemical cure.  She has history of primary hyperparathyroidism which required left inferior parathyroidectomy with subsequent stabilization of PTH/calcium.  She underwent genetic testing for MEN-1 in 2014 which was negative.   -However, she did not have sufficient recent clinical or lab information to proceed with any definitive treatment plan.  I had a long discussion with her regarding the need to update her basic endocrine work-up including for her history of hypothyroidism, hypothyroidism. -I will proceed to obtain a foreign labs:  - Vitamin D, 25-OH,Total,IA(Refl) - T3, free - PTH, intact and calcium - Gastrin - Prolactin - Magnesium - Phosphorus - Cortisol-am, blood - T4, free - TSH  She is also of her baseline thyroid/neck ultrasound . - US THYROID; Future  -she will return in 2 week to review her repeat labs. - I did not initiate any new prescriptions today. - I advised her  to maintain close follow up with Glori Bickersrivedi, Rajendra, MD for primary care needs.   - Time spent with the patient: 5745  minutes, of which >50% was spent in obtaining information about her symptoms, reviewing her previous labs/studies,  evaluations, and treatments, counseling her about her history of hyperparathyroidism, hypothyroidism, and developing a plan to confirm the diagnosis and long term treatment based on the latest standards of care/guidelines.    Jeanette Hard participated in the discussions, expressed understanding, and voiced agreement with the above plans.  All questions were answered to her satisfaction. she is encouraged to contact clinic should she have any questions or concerns prior  to her return visit.  Follow up plan: Return in about 2 weeks (around 03/07/2019), or Labs fasting before 8AM, for Follow up with Pre-visit Labs.   Marquis Lunch, MD [facility] [address] [place], [place] [address] Phone: [phone]  Fax: [phone]     02/21/2019, 7:09 PM  This note was partially dictated with voice recognition software. Similar sounding words can be transcribed inadequately or may not  be corrected upon review.

## 2019-02-22 DIAGNOSIS — R7303 Prediabetes: Secondary | ICD-10-CM | POA: Diagnosis not present

## 2019-02-22 DIAGNOSIS — E0590 Thyrotoxicosis, unspecified without thyrotoxic crisis or storm: Secondary | ICD-10-CM | POA: Diagnosis not present

## 2019-02-22 DIAGNOSIS — R739 Hyperglycemia, unspecified: Secondary | ICD-10-CM | POA: Diagnosis not present

## 2019-02-22 DIAGNOSIS — E559 Vitamin D deficiency, unspecified: Secondary | ICD-10-CM | POA: Diagnosis not present

## 2019-02-22 DIAGNOSIS — E349 Endocrine disorder, unspecified: Secondary | ICD-10-CM | POA: Diagnosis not present

## 2019-02-23 ENCOUNTER — Encounter: Payer: Self-pay | Admitting: "Endocrinology

## 2019-02-24 LAB — HEMOGLOBIN A1C
Hgb A1c MFr Bld: 5.3 % of total Hgb (ref <5.7)
Mean Plasma Glucose: 105 (calc)
eAG (mmol/L): 5.8 (calc)

## 2019-02-24 LAB — T4, FREE: Free T4: 1.2 ng/dL (ref 0.8–1.8)

## 2019-02-24 LAB — VITAMIN D 25 HYDROXY (VIT D DEFICIENCY, FRACTURES): Vit D, 25-Hydroxy: 35 ng/mL (ref 30–100)

## 2019-02-24 LAB — PTH, INTACT AND CALCIUM
Calcium: 9.9 mg/dL (ref 8.6–10.2)
PTH: 57 pg/mL (ref 14–64)

## 2019-02-24 LAB — GASTRIN: Gastrin: 20 pg/mL (ref <=100)

## 2019-02-24 LAB — PHOSPHORUS: Phosphorus: 4.4 mg/dL (ref 2.5–4.5)

## 2019-02-24 LAB — T3, FREE: T3, Free: 3.1 pg/mL (ref 2.3–4.2)

## 2019-02-24 LAB — MAGNESIUM: Magnesium: 2.1 mg/dL (ref 1.5–2.5)

## 2019-02-24 LAB — TSH: TSH: 1.62 mIU/L

## 2019-02-24 LAB — CORTISOL-AM, BLOOD: Cortisol - AM: 12.5 ug/dL

## 2019-02-24 LAB — PROLACTIN: Prolactin: 9.4 ng/mL

## 2019-03-01 ENCOUNTER — Ambulatory Visit (HOSPITAL_COMMUNITY): Payer: BLUE CROSS/BLUE SHIELD

## 2019-03-06 ENCOUNTER — Ambulatory Visit (HOSPITAL_COMMUNITY)
Admission: RE | Admit: 2019-03-06 | Discharge: 2019-03-06 | Disposition: A | Discharge status: Home / Self Care | Payer: BC Managed Care – PPO | Source: Ambulatory Visit | Attending: "Endocrinology | Admitting: "Endocrinology

## 2019-03-06 ENCOUNTER — Other Ambulatory Visit: Payer: Self-pay

## 2019-03-06 DIAGNOSIS — E349 Endocrine disorder, unspecified: Secondary | ICD-10-CM | POA: Diagnosis not present

## 2019-03-06 DIAGNOSIS — E042 Nontoxic multinodular goiter: Secondary | ICD-10-CM | POA: Diagnosis not present

## 2019-03-07 ENCOUNTER — Encounter: Payer: Self-pay | Admitting: "Endocrinology

## 2019-03-07 ENCOUNTER — Ambulatory Visit (INDEPENDENT_AMBULATORY_CARE_PROVIDER_SITE_OTHER): Payer: BC Managed Care – PPO | Admitting: "Endocrinology

## 2019-03-07 VITALS — BP 112/70 | HR 91 | Ht 65.0 in | Wt 235.0 lb

## 2019-03-07 DIAGNOSIS — E0590 Thyrotoxicosis, unspecified without thyrotoxic crisis or storm: Secondary | ICD-10-CM | POA: Diagnosis not present

## 2019-03-07 DIAGNOSIS — E349 Endocrine disorder, unspecified: Secondary | ICD-10-CM | POA: Diagnosis not present

## 2019-03-07 NOTE — Progress Notes (Signed)
03/07/2019, 5:09 PM  Endocrinology follow-up note   Subjective:    Patient ID: Jeanette Cummings, female    DOB: 23-Apr-1971, PCP Keane Police, MD   Past Medical History:  Diagnosis Date  . Autoimmune disease ([place])    large family history  . Frequent headaches   . GERD (gastroesophageal reflux disease)   . History of cervical biopsy    multiple  . Hyperthyroidism   . 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 Multiple thyroid nodules   . Neuropathy   . Sjogren's disease ([place])   . Stroke [facility])    diagnosed with vasculitis- 2017  . Vasculitis [facility])    Past Surgical History:  Procedure Laterality Date  . BIOPSY  10/27/2016   Procedure: BIOPSY;  Surgeon: Danie Binder, MD;  Location[facility];  Service: Endoscopy;;  colon  . CERVICAL CONE BIOPSY     "several".  . COLONOSCOPY N/A 09/01/2013   Dr. Oneida Alar- normal mucosa in the terminal ileum. the colon is redundant, small internal hemorrhoids.  . COLONOSCOPY WITH PROPOFOL N/A 10/27/2016   Dr. Oneida Alar: normal TI. moderate IH, large external hemorrhoids. Benign rectal polyp. COlonoscopy 10 years   . ESOPHAGEAL MANOMETRY  2018   Performed at [facility]. Normal  . ESOPHAGOGASTRODUODENOSCOPY N/A 09/01/2013   Dr.Fields- esophagus appeared normal, small hiatal hernia, moderate non-erosive gastritis found in gastric antrum. duodenum= no abnormalities bx= minimal chronic infammation.  . ESOPHAGOGASTRODUODENOSCOPY  04/2017   [facility]: empiric dilation, mild chronic gastritis, negative EOE  . ESOPHAGOGASTRODUODENOSCOPY (EGD) WITH PROPOFOL N/A 10/27/2016   Dr. Oneida Alar: normal esophagus s/p biopsy with reflux changes. Normal stomach, duodenum.   Marland Kitchen FOOT SURGERY Bilateral    bunionectomy  . history of thyroid radiation  2008  . parathyroidectomy  2014  . POLYPECTOMY  10/27/2016   Procedure: POLYPECTOMY;  Surgeon: Danie Binder, MD;  [facility];  Service: Endoscopy;;  rectum  .  SAVORY DILATION N/A 10/27/2016   Procedure: SAVORY DILATION;  Surgeon: Danie Binder, MD;  Location[facility];  Service: Endoscopy;  Laterality: N/A;  . THYMECTOMY  03/09/05  . WISDOM TOOTH EXTRACTION     Social History   Socioeconomic History  . Marital status: Married    Spouse name: Not on file  . Number of children: 2  . Years of education: Not on file  . Highest education level: Not on file  Occupational History  . Occupation:    Social Needs  . Financial resource strain: Not on file  . Food insecurity    Worry: Not on file    Inability: Not on file  . Transportation needs    Medical: Not on file    Non-medical: Not on file  Tobacco Use  . Smoking status: Never Smoker  . Smokeless tobacco: Never Used  Substance and Sexual Activity  . Alcohol use: No  . Drug use: No  . Sexual activity: Yes    Birth control/protection: Injection  Lifestyle  . Physical activity    Days per week: Not on file    Minutes per session: Not on file  . Stress: Not on file  Relationships  . Social connections    Talks  on phone: Not on file    Gets together: Not on file    Attends religious service: Not on file    Active member of club or organization: Not on file    Attends meetings of clubs or organizations: Not on file    Relationship status: Not on file  Other Topics Concern  . Not on file  Social History Narrative  . Not on file   Family History  Problem Relation Age of Onset  . Colon polyps Mother        numerous  . Breast cancer Mother        age 67s  . Cancer Mother        died from metastatic brain cancer, not from breast cancer, unknown primary  . Colon cancer Maternal Aunt        diagnosed at age 5, deceased  . Colon cancer Cousin        diagnosed at age 11, deceased age 81  . Colon polyps Cousin        precancerous, age 2s  . Breast cancer Maternal Aunt        age 29, at age 18 new primry lung cancer deceased. Sjogrens', polymyositis, hyperthyroid, scleroderma   . Breast cancer Maternal Aunt        late 71s  . Ovarian cancer Paternal Grandmother   . Ovarian cancer Cousin        age 7, paternal  . Lung cancer Maternal Grandmother        died age 77  . Other Son        IgA/IgE deficient   Outpatient Encounter Medications as of 03/07/2019  Medication Sig  . acetaminophen (TYLENOL) 500 MG tablet Take 1,000 mg by mouth 2 (two) times daily.   . Calcium-Magnesium-Zinc 333-133-5 MG TABS Take by mouth 2 (two) times a day.  . cycloSPORINE (RESTASIS) 0.05 % ophthalmic emulsion Place 1 drop into both eyes 2 (two) times daily.   . DULoxetine (CYMBALTA) 30 MG capsule daily.  . famotidine (PEPCID) 40 MG tablet daily.  . fluorometholone (FML) 0.1 % ophthalmic suspension Apply 1 drop to eye 2 (two) times daily as needed (IRRITATION).   . Gabapentin, Once-Daily, (GRALISE) 600 MG TABS Take 1,800 mg by mouth daily.  . hydroxychloroquine (PLAQUENIL) 200 MG tablet Take 400 mg by mouth daily.  Marland Kitchen ibuprofen (ADVIL,MOTRIN) 200 MG tablet Take 400 mg by mouth 2 (two) times daily.   Marland Kitchen lidocaine (XYLOCAINE) 2 % solution Use as directed 15 mLs in the mouth or throat every 6 (six) hours as needed for mouth pain.  . MedroxyPROGESTERone Acetate 150 MG/ML SUSY Inject 150 mg into the muscle every 3 (three) months.  Marland Kitchen omeprazole (PRILOSEC) 20 MG capsule Take 1 capsule (20 mg total) by mouth 2 (two) times daily before a meal. (Patient taking differently: Take 40 mg by mouth 2 (two) times daily before a meal. )  . OnabotulinumtoxinA (BOTOX IJ) Every 12 weeks  . Polyethyl Glycol-Propyl Glycol (SYSTANE OP) Place 2 drops into both eyes every 4 (four) hours as needed (dry eyes).   Marland Kitchen PRESCRIPTION MEDICATION Botox injection every 12 weeks  . torsemide (DEMADEX) 20 MG tablet daily.  . [DISCONTINUED] Gabapentin, Once-Daily, (GRALISE) 600 MG TABS Take 1,800 mg by mouth every evening.    No facility-administered encounter medications on file as of 03/07/2019.    ALLERGIES: Allergies   Allergen Reactions  . Sumatriptan Other (See Comments)    Pain, burning and tingling sensation in jaw,  neck and shoulders.    VACCINATION STATUS:  There is no immunization history on file for this patient.  HPI Jeanette Cummings is 48 y.o. female who presents today for follow-up with new set of labs and thyroid [place] Evenerultrasound-she was seen in consultation for history of  multiple endocrine disorders requested by Glori Bickersrivedi, Rajendra, MD.  Her history includes primary hyperparathyroidism which required left inferior parathyroidectomy in [facility] by Dr. Neita GoodnightSosa in August 2014, with subsequent normalization of PTH and calcium levels.  She also gives history of hypothyroidism which required treatment with I-131 treatment in 2008 with partial response which required methimazole intervention for 18 months completed prior to 2010.   -She underwent genetic testing for MEN-1 in 2014 in [facility] which was negative. She does not have recent labs for parathyroid, thyroid, pituitary, functional assessment.    -Her previsit labs indicate stable thyroid, parathyroid, pituitary, and adrenal function.  She has no new complaints today.  She is not currently on thyroid hormone supplements. -Denies history of nephrolithiasis, osteoporosis.  She denies palpitations, tremors, heat intolerance.  She denies any history of hypoglycemia, active skin rash.  Reportedly, she is being worked up for this issue of Sjogren's disease by her rheumatologist.  She is currently on Plaquenil 400 mg daily, as well as anti-depression prescriptions.  -She has GERD for which she underwent multiple endoscopies, currently on omeprazole 20 g p.o. daily.    -She has multiple family members with various thyroid dysfunctions including an aunt, her son, uncle, and cousin.  She denies family history of thyroid malignancy. -She did not have recent thyroid ultrasound.   -Her previous thyroid ultrasound shows slight decrease in the size of her thyroid  to 4.9 cm in the right lobe and 4.2 cm with an echo.  She has heterogeneous echotexture with no discrete nodules >1cm.  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  She denies any history of myasthenia gravis.    Review of Systems  Constitutional: + Progressive weight gain , +fatigue, no subjective hyperthermia, no subjective hypothermia Eyes: no blurry vision, no xerophthalmia ENT: no sore throat, no nodules palpated in throat, no dysphagia/odynophagia, no hoarseness Cardiovascular: no Chest Pain, no Shortness of Breath, no palpitations, no leg swelling Respiratory: no cough, no shortness of breath Gastrointestinal: no Nausea/Vomiting/Diarhhea Musculoskeletal: no muscle/joint aches Skin: no rashes, + dry skin Neurological: no tremors, no numbness, no tingling, no dizziness Psychiatric: no depression, no anxiety  Objective:    BP 112/70   Pulse 91   Ht 5\' 5"  (1.651 m)   Wt 235 lb (106.6 kg)   BMI 39.11 kg/m   Wt Readings from Last 3 Encounters:  03/07/19 235 lb (106.6 kg)  02/21/19 229 lb (103.9 kg)  06/29/17 228 lb 12.8 oz (103.8 kg)    Physical Exam  Constitutional: + Obese with a BMI of 38, not in acute distress, normal state of mind Eyes: PERRLA, EOMI, no exophthalmos ENT: moist mucous membranes, no gross thyromegaly, no gross cervical lymphadenopathy  Musculoskeletal: no gross deformities, strength intact in all four extremities Skin: moist, warm, no rashes Neurological: no tremor with outstretched hands, Deep tendon reflexes normal in bilateral lower extremities.    Lab Results  Component Value Date   HGBA1C 5.3 02/22/2019   HGBA1C 5.4 08/11/2013   Recent Results (from the past 2160 hour(s))  T3, free     Status: None   Collection Time: 02/22/19  8:20 AM  Result Value Ref Range   T3, Free 3.1 2.3 - 4.2 pg/mL  PTH, intact and calcium     Status: None   Collection Time: 02/22/19  8:20 AM   Result Value Ref Range   PTH 57 14 - 64 pg/mL    Comment: . Interpretive Guide    Intact PTH           Calcium ------------------    ----------           ------- Normal Parathyroid    Normal               Normal Hypoparathyroidism    Low or Low Normal    Low Hyperparathyroidism    Primary            Normal or High       High    Secondary          High                 Normal or Low    Tertiary           High                 High Non-Parathyroid    Hypercalcemia      Low or Low Normal    High .    Calcium 9.9 8.6 - 10.2 mg/dL  Gastrin     Status: None   Collection Time: 02/22/19  8:20 AM  Result Value Ref Range   Gastrin 20 < OR = 100 pg/mL    Comment: . Reference range applies to fasting specimens only. . . For additional information, please refer to  http://education.questdiagnostics.com/faq/FAQ202  (This link is being provided for informational/ educational purposes only.) .   Prolactin     Status: None   Collection Time: 02/22/19  8:20 AM  Result Value Ref Range   Prolactin 9.4 ng/mL    Comment:             Reference Range  Females         Non-pregnant        3.0-30.0         Pregnant           10.0-209.0         Postmenopausal      2.0-20.0 . . .   Magnesium     Status: None   Collection Time: 02/22/19  8:20 AM  Result Value Ref Range   Magnesium 2.1 1.5 - 2.5 mg/dL  Phosphorus     Status: None   Collection Time: 02/22/19  8:20 AM  Result Value Ref Range   Phosphorus 4.4 2.5 - 4.5 mg/dL  Cortisol-am, blood     Status: None   Collection Time: 02/22/19  8:20 AM  Result Value Ref Range   Cortisol - AM 12.5 mcg/dL    Comment: Reference Range 8 a.m. (7-9 a.m.) Specimen: 4.0-22.0 .   Hemoglobin A1c     Status: None   Collection Time: 02/22/19  8:20 AM  Result Value Ref Range   Hgb A1c MFr Bld 5.3 <5.7 % of total Hgb    Comment: For the purpose of screening for the presence of diabetes: . <5.7%       Consistent with the absence of diabetes 5.7-6.4%     Consistent with increased risk for diabetes             (prediabetes) > or =6.5%  Consistent with diabetes . This  assay result is consistent with a decreased risk of diabetes. . Currently, no consensus exists regarding use of hemoglobin A1c for diagnosis of diabetes in children. . According to American Diabetes Association (ADA) guidelines, hemoglobin A1c <7.0% represents optimal control in non-pregnant diabetic patients. Different metrics may apply to specific patient populations.  Standards of Medical Care in Diabetes(ADA). .    Mean Plasma Glucose 105 (calc)   eAG (mmol/L) 5.8 (calc)  T4, free     Status: None   Collection Time: 02/22/19  8:20 AM  Result Value Ref Range   Free T4 1.2 0.8 - 1.8 ng/dL  TSH     Status: None   Collection Time: 02/22/19  8:20 AM  Result Value Ref Range   TSH 1.62 mIU/L    Comment:           Reference Range .           > or = 20 Years  0.40-4.50 .                Pregnancy Ranges           First trimester    0.26-2.66           Second trimester   0.55-2.73           Third trimester    0.43-2.91   VITAMIN D 25 Hydroxy (Vit-D Deficiency, Fractures)     Status: None   Collection Time: 02/22/19  8:20 AM  Result Value Ref Range   Vit D, 25-Hydroxy 35 30 - 100 ng/mL    Comment: Vitamin D Status         25-OH Vitamin D: . Deficiency:                    <20 ng/mL Insufficiency:             20 - 29 ng/mL Optimal:                 > or = 30 ng/mL . For 25-OH Vitamin D testing on patients on  D2-supplementation and patients for whom quantitation  of D2 and D3 fractions is required, the [employer]ureD(TM) 25-OH VIT D, (D2,D3), LC/MS/MS is recommended: order  code [id number] (patients >2016yrs). See Note 1 . Note 1 . For additional information, please refer to  http://education.[facility].com/faq/FAQ199  (This link is being provided for informational/ educational purposes only.)        Assessment & Plan:   1.  History of hyperthyroidism  status post RAI 2.  History of hyperparathyroidism- status post surgery  -I discussed her labs and thyroid ultrasound with her. -Her previsit labs rule out current thyroid, parathyroid, adrenal, thyroid dysfunction.  She will not need any intervention at this time.  Her thyroid ultrasound is significant for some mild shrinkage of overall size of her thyroid with no discrete nodule greater than 1 cm.  She would not need intervention at this time.  She is approached to return in 1 year with labs for prolactin, and thyroid function tests.  Time for this visit: 15 minutes. Jeanette Cummings  participated in the discussions, expressed understanding, and voiced agreement with the above plans.  All questions were answered to her satisfaction. she is encouraged to contact clinic should she have any questions or concerns prior to her return visit.  Follow up plan: Return in about 1 year (around 03/06/2020) for Follow up with Pre-visit Labs.   Marquis LunchGebre Kayleen Alig, MD [facility] [address] [place], [place] [address] Phone: [phone]  Fax: [phone]     03/07/2019, 5:09 PM  This note was partially dictated with voice recognition software. Similar sounding words can be transcribed inadequately or may not  be corrected upon review.

## 2019-07-02 NOTE — Progress Notes (Signed)
REVIEWED-NO ADDITIONAL RECOMMENDATIONS. 

## 2019-12-02 IMAGING — US US THYROID
1 series · 14 of 25 positions shown · non-contrast
Comparison: None available

CLINICAL DATA: Multinodular goiter

EXAM:
THYROID ULTRASOUND
TECHNIQUE: Ultrasound examination of the thyroid gland and adjacent soft
tissues was performed.

[Series 1: us thyroid · 14 of 34 slices shown]
[im 1/34]
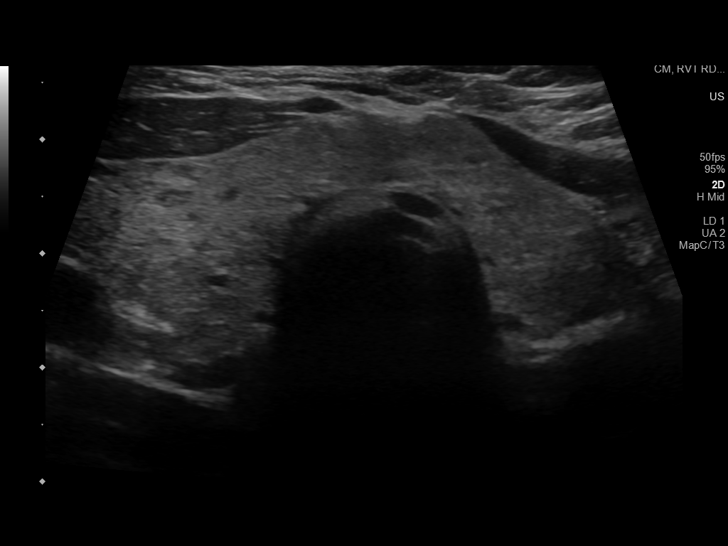
[im 3/34]
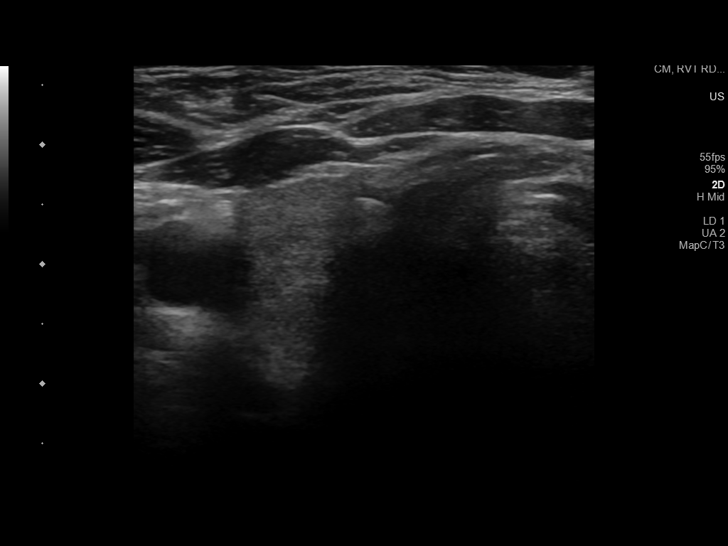
[im 6/34]
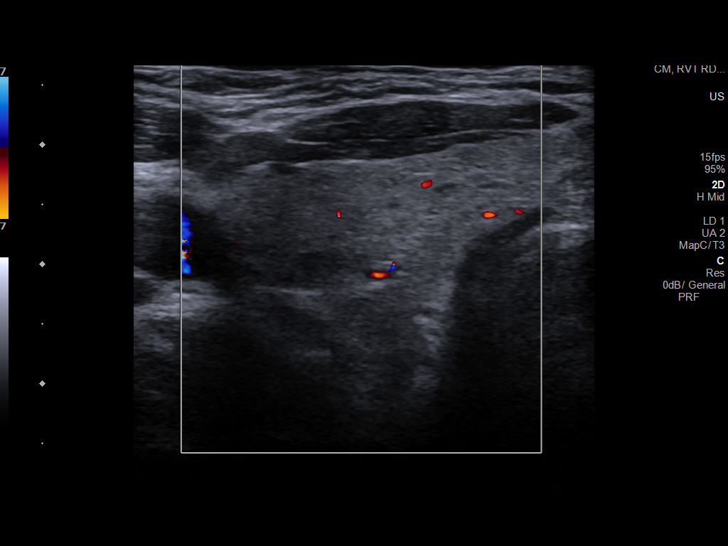
[im 9/34]
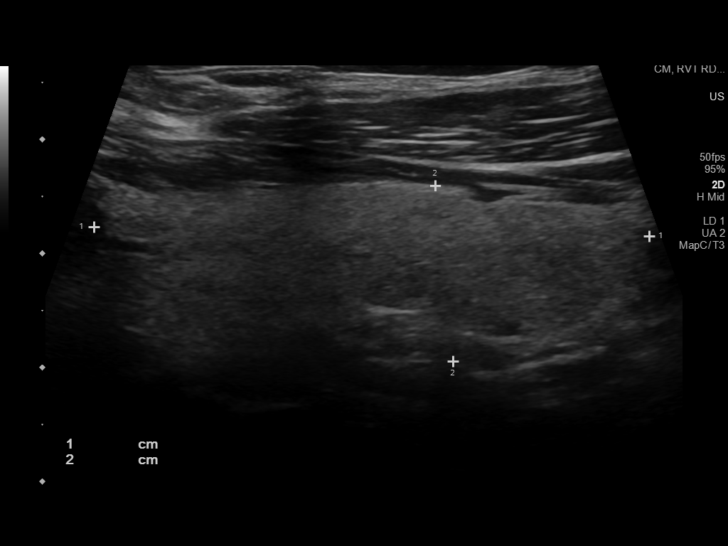
[im 12/34]
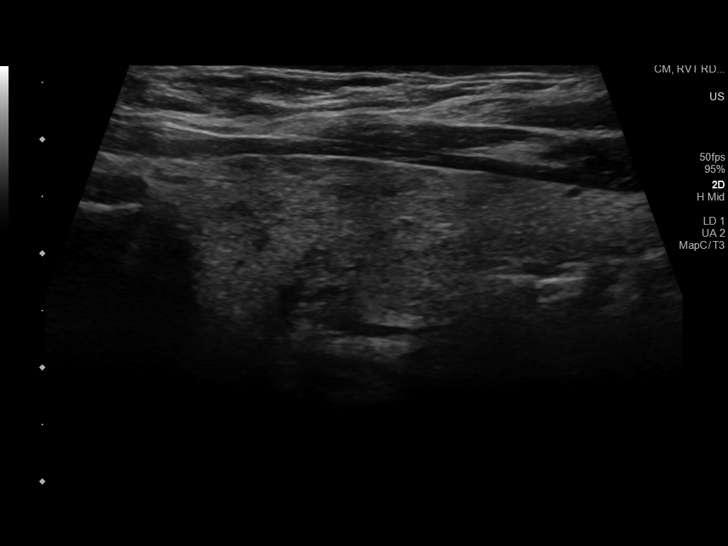
[im 13/34]
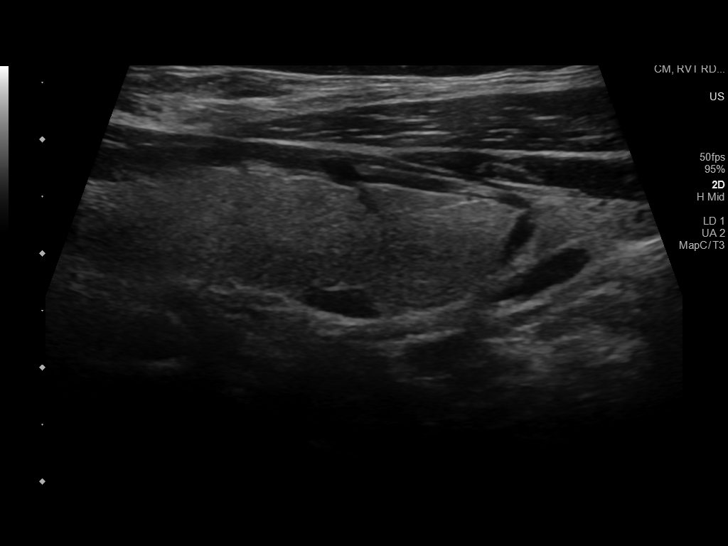
[im 16/34]
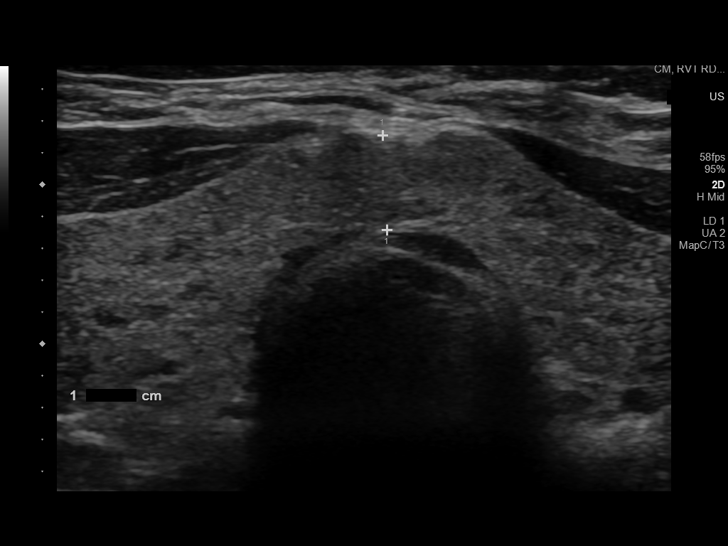
[im 18/34]
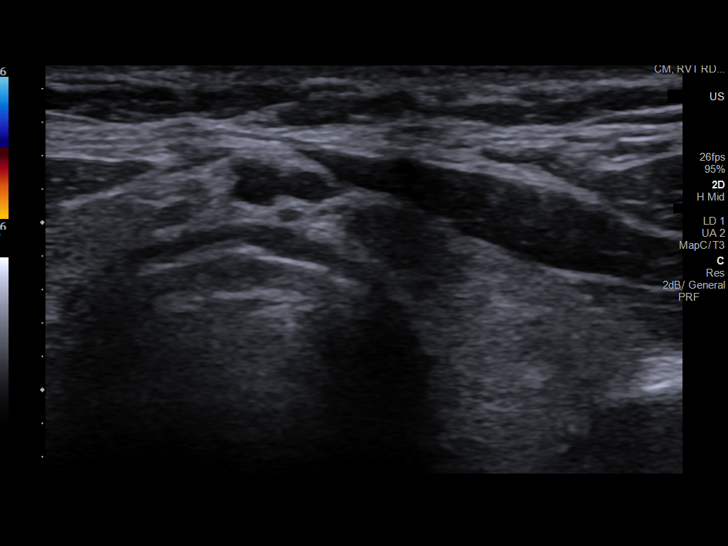
[im 21/34]
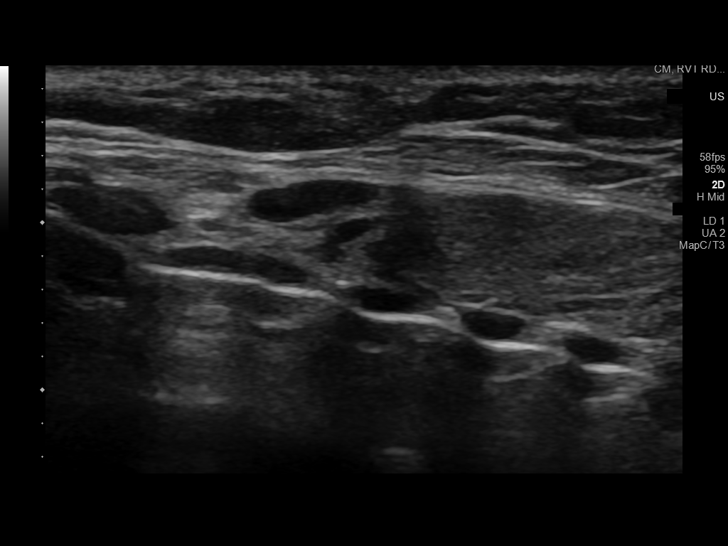
[im 23/34]
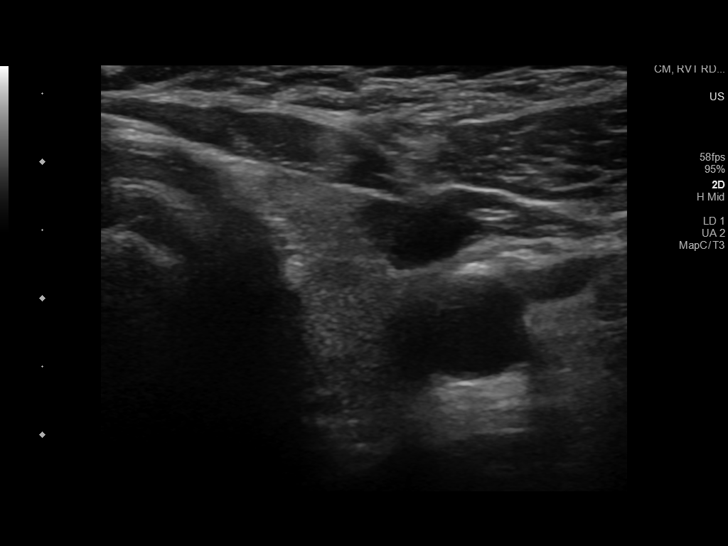
[im 25/34]
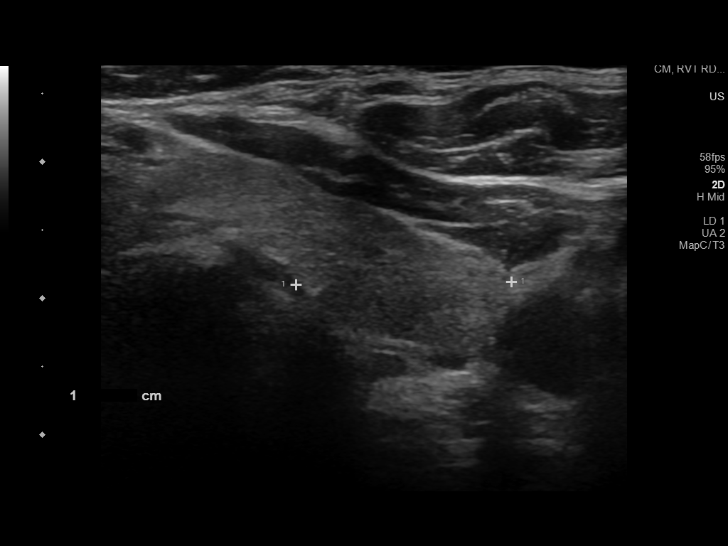
[im 28/34]
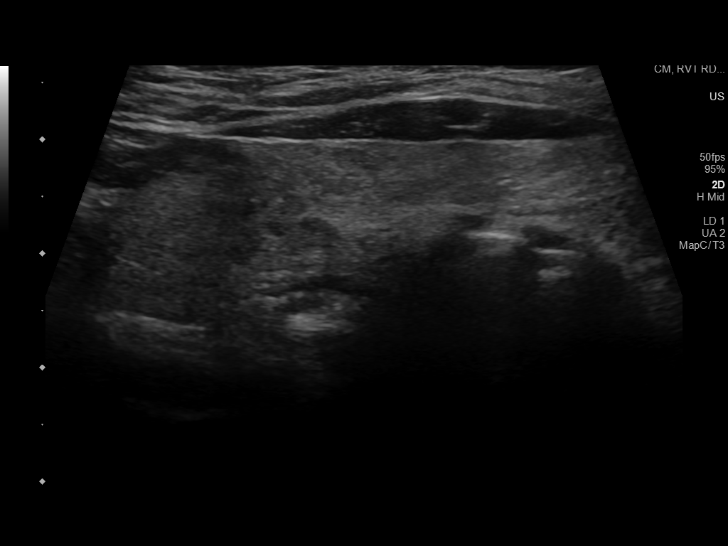
[im 31/34]
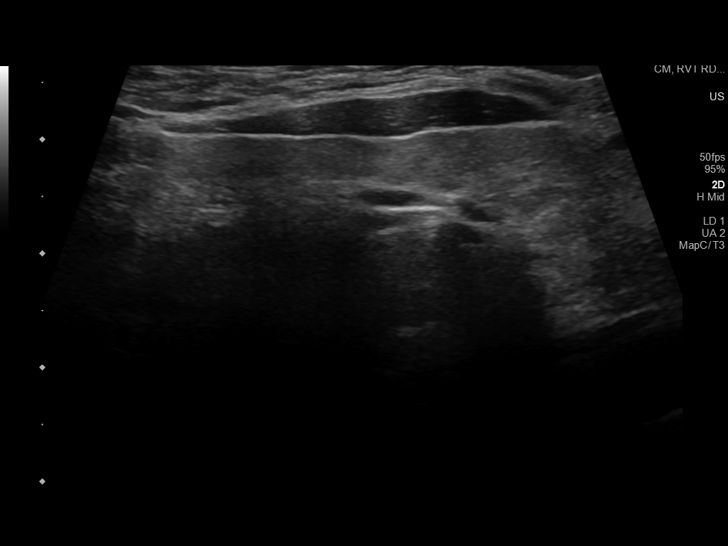
[im 34/34]
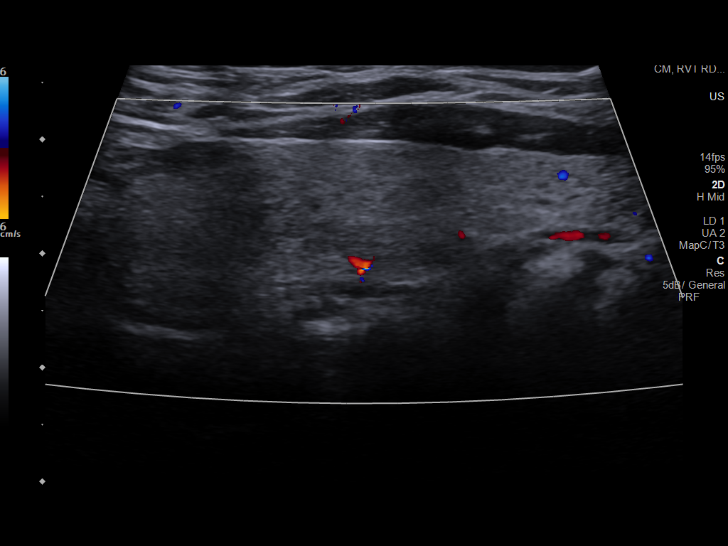

[14 of 25 positions shown; findings below may reference images not displayed]

FINDINGS: Parenchymal Echotexture: Mildly heterogenous

Isthmus: 0.6 cm thickness

Right lobe: 4.9 x 1.6 x 2 cm

Left lobe: 4.2 x 1.6 x 1.6 cm

_________________________________________________________

Estimated total number of nodules >/= 1 cm: 0

Number of spongiform nodules >/=  2 cm not described below (TR1): 0

Number of mixed cystic and solid nodules >/= 1.5 cm not described
below (TR2): 0

_________________________________________________________

0.8 cm complex cyst, isthmus.  No other discrete nodules.
IMPRESSION: Mildly heterogenous thyroid with subcentimeter isthmic complex cyst.

No indication for biopsy or dedicated imaging follow-up.

The above is in keeping with the ACR TI-RADS recommendations - [HOSPITAL] 6490;[DATE].

## 2020-03-08 ENCOUNTER — Ambulatory Visit: Payer: Medicare Other | Admitting: "Endocrinology
